# Patient Record
Sex: Female | Born: 1995 | Race: Black or African American | Hispanic: No | Smoking: Never smoker
Health system: Southern US, Community
[De-identification: ages and names within clinical notes are randomized; demographics above are authoritative.]

## PROBLEM LIST (undated history)

## (undated) ENCOUNTER — Inpatient Hospital Stay (HOSPITAL_COMMUNITY): Payer: Self-pay

## (undated) DIAGNOSIS — E559 Vitamin D deficiency, unspecified: Secondary | ICD-10-CM

## (undated) DIAGNOSIS — A749 Chlamydial infection, unspecified: Secondary | ICD-10-CM

## (undated) DIAGNOSIS — Z789 Other specified health status: Secondary | ICD-10-CM

## (undated) HISTORY — DX: Chlamydial infection, unspecified: A74.9

## (undated) HISTORY — PX: NO PAST SURGERIES: SHX2092

## (undated) HISTORY — DX: Vitamin D deficiency, unspecified: E55.9

---

## 2016-02-21 ENCOUNTER — Ambulatory Visit: Payer: Self-pay | Admitting: Women's Health

## 2018-05-02 ENCOUNTER — Other Ambulatory Visit (HOSPITAL_COMMUNITY): Payer: Self-pay | Admitting: Nurse Practitioner

## 2018-05-02 DIAGNOSIS — Z369 Encounter for antenatal screening, unspecified: Secondary | ICD-10-CM

## 2018-05-02 LAB — OB RESULTS CONSOLE ANTIBODY SCREEN: ANTIBODY SCREEN: NEGATIVE

## 2018-05-02 LAB — OB RESULTS CONSOLE HIV ANTIBODY (ROUTINE TESTING): HIV: NONREACTIVE

## 2018-05-02 LAB — OB RESULTS CONSOLE HEPATITIS B SURFACE ANTIGEN: Hepatitis B Surface Ag: NEGATIVE

## 2018-05-02 LAB — OB RESULTS CONSOLE RPR: RPR: NONREACTIVE

## 2018-05-02 LAB — OB RESULTS CONSOLE RUBELLA ANTIBODY, IGM: Rubella: IMMUNE

## 2018-05-02 LAB — OB RESULTS CONSOLE ABO/RH: RH Type: POSITIVE

## 2018-05-09 ENCOUNTER — Encounter (HOSPITAL_COMMUNITY): Payer: Self-pay

## 2018-05-15 ENCOUNTER — Encounter (HOSPITAL_COMMUNITY): Payer: Self-pay | Admitting: *Deleted

## 2018-05-17 ENCOUNTER — Other Ambulatory Visit (HOSPITAL_COMMUNITY): Payer: Self-pay | Admitting: Nurse Practitioner

## 2018-05-17 ENCOUNTER — Encounter (HOSPITAL_COMMUNITY): Payer: Self-pay

## 2018-05-17 ENCOUNTER — Ambulatory Visit (HOSPITAL_COMMUNITY)
Admission: RE | Admit: 2018-05-17 | Discharge: 2018-05-17 | Disposition: A | Discharge status: Home / Self Care | Payer: Medicaid Other | Source: Ambulatory Visit | Attending: Nurse Practitioner | Admitting: Nurse Practitioner

## 2018-05-17 ENCOUNTER — Ambulatory Visit (HOSPITAL_COMMUNITY): Admission: RE | Admit: 2018-05-17 | Payer: Medicaid Other | Source: Ambulatory Visit

## 2018-05-17 DIAGNOSIS — O99211 Obesity complicating pregnancy, first trimester: Secondary | ICD-10-CM

## 2018-05-17 DIAGNOSIS — Z3682 Encounter for antenatal screening for nuchal translucency: Secondary | ICD-10-CM

## 2018-05-17 DIAGNOSIS — Z369 Encounter for antenatal screening, unspecified: Secondary | ICD-10-CM

## 2018-05-17 DIAGNOSIS — O99212 Obesity complicating pregnancy, second trimester: Secondary | ICD-10-CM

## 2018-05-17 DIAGNOSIS — Z3A13 13 weeks gestation of pregnancy: Secondary | ICD-10-CM

## 2018-05-17 HISTORY — DX: Other specified health status: Z78.9

## 2018-05-19 ENCOUNTER — Other Ambulatory Visit (HOSPITAL_COMMUNITY): Payer: Self-pay | Admitting: Nurse Practitioner

## 2018-05-19 DIAGNOSIS — Z3682 Encounter for antenatal screening for nuchal translucency: Secondary | ICD-10-CM

## 2018-05-20 ENCOUNTER — Other Ambulatory Visit (HOSPITAL_COMMUNITY): Payer: Self-pay | Admitting: Nurse Practitioner

## 2018-05-20 ENCOUNTER — Encounter (HOSPITAL_COMMUNITY): Payer: Self-pay

## 2018-05-20 ENCOUNTER — Ambulatory Visit (HOSPITAL_COMMUNITY)
Admission: RE | Admit: 2018-05-20 | Discharge: 2018-05-20 | Disposition: A | Discharge status: Home / Self Care | Payer: Medicaid Other | Source: Ambulatory Visit | Attending: Nurse Practitioner | Admitting: Nurse Practitioner

## 2018-05-20 ENCOUNTER — Ambulatory Visit (HOSPITAL_COMMUNITY)
Admission: RE | Admit: 2018-05-20 | Payer: Medicaid Other | Source: Ambulatory Visit | Attending: Obstetrics & Gynecology | Admitting: Obstetrics & Gynecology

## 2018-05-20 DIAGNOSIS — O99212 Obesity complicating pregnancy, second trimester: Secondary | ICD-10-CM

## 2018-05-20 DIAGNOSIS — O99211 Obesity complicating pregnancy, first trimester: Secondary | ICD-10-CM | POA: Diagnosis not present

## 2018-05-20 DIAGNOSIS — Z3A13 13 weeks gestation of pregnancy: Secondary | ICD-10-CM | POA: Diagnosis not present

## 2018-05-20 DIAGNOSIS — O321XX0 Maternal care for breech presentation, not applicable or unspecified: Secondary | ICD-10-CM | POA: Insufficient documentation

## 2018-05-20 DIAGNOSIS — Z3682 Encounter for antenatal screening for nuchal translucency: Secondary | ICD-10-CM | POA: Insufficient documentation

## 2018-05-20 DIAGNOSIS — E669 Obesity, unspecified: Secondary | ICD-10-CM | POA: Insufficient documentation

## 2018-10-01 ENCOUNTER — Encounter (HOSPITAL_COMMUNITY): Payer: Self-pay | Admitting: *Deleted

## 2018-10-01 ENCOUNTER — Inpatient Hospital Stay (HOSPITAL_COMMUNITY)
Admission: AD | Admit: 2018-10-01 | Discharge: 2018-10-01 | Disposition: A | Discharge status: Home / Self Care | Payer: BLUE CROSS/BLUE SHIELD | Source: Ambulatory Visit | Attending: Obstetrics & Gynecology | Admitting: Obstetrics & Gynecology

## 2018-10-01 DIAGNOSIS — Z3A32 32 weeks gestation of pregnancy: Secondary | ICD-10-CM

## 2018-10-01 DIAGNOSIS — R102 Pelvic and perineal pain: Secondary | ICD-10-CM | POA: Diagnosis not present

## 2018-10-01 DIAGNOSIS — O4703 False labor before 37 completed weeks of gestation, third trimester: Secondary | ICD-10-CM

## 2018-10-01 DIAGNOSIS — O26893 Other specified pregnancy related conditions, third trimester: Secondary | ICD-10-CM

## 2018-10-01 LAB — URINALYSIS, ROUTINE W REFLEX MICROSCOPIC
Bilirubin Urine: NEGATIVE
Glucose, UA: NEGATIVE mg/dL
HGB URINE DIPSTICK: NEGATIVE
Ketones, ur: NEGATIVE mg/dL
Nitrite: NEGATIVE
Protein, ur: NEGATIVE mg/dL
SPECIFIC GRAVITY, URINE: 1.006 (ref 1.005–1.030)
pH: 6 (ref 5.0–8.0)

## 2018-10-01 NOTE — Discharge Instructions (Signed)

## 2018-10-01 NOTE — MAU Note (Signed)
Pt had pelvic pain yesterday, was hard to walk, is more pressure today.  Feeling cramping at the top of her abdomen every 5-6 minutes since 1030.  Denies bleeding or LOF.  Reports good fetal movement.

## 2018-10-01 NOTE — MAU Provider Note (Signed)
History     CSN: [id number]  Arrival date and time: 10/01/18 1228   First Provider Initiated Contact with Patient 10/01/18 1320      Chief Complaint  Patient presents with  . Abdominal Pain  . pelvic pressure   HPI Patty Wells is a 22 y.o. G1P0 at 10919w5d who presents with pelvic pressure and contractions. She states she had a sudden increase in pelvic pressure last night making it difficult to walk today. She states she started having contractions every 5 minutes this morning that have since stopped. She denies bleeding or leaking of fluid. Reports normal fetal movement. [facility] and denies any problems so far.   OB History    Gravida  1   Para      Term      Preterm      AB      Living  0     SAB      TAB      Ectopic      Multiple      Live Births              Past Medical History:  Diagnosis Date  . Medical history non-contributory     Past Surgical History:  Procedure Laterality Date  . NO PAST SURGERIES      Family History  Problem Relation Age of Onset  . Hypertension Mother   . Diabetes Maternal Grandmother   . Diabetes Paternal Grandmother     Social History   Tobacco Use  . Smoking status: Never Smoker  . Smokeless tobacco: Never Used  Substance Use Topics  . Alcohol use: Never    Frequency: Never  . Drug use: Not Currently    Types: Marijuana    Comment: Not during pregnancy    Allergies: No Known Allergies  Medications Prior to Admission  Medication Sig Dispense Refill Last Dose  . Acetaminophen (TYLENOL PO) Take by mouth.   Taking  . Prenatal Vit w/Fe-Methylfol-FA (PNV PO) Take by mouth.   Taking    Review of Systems  Constitutional: Negative.  Negative for fatigue and fever.  HENT: Negative.   Respiratory: Negative.  Negative for shortness of breath.   Cardiovascular: Negative.  Negative for chest pain.  Gastrointestinal: Positive for abdominal pain. Negative for constipation, diarrhea,  nausea and vomiting.  Genitourinary: Positive for pelvic pain. Negative for dysuria, vaginal bleeding and vaginal discharge.  Neurological: Negative.  Negative for dizziness and headaches.   Physical Exam   Blood pressure 127/74, pulse (!) 101, temperature 98.3 F (36.8 C), temperature source Oral, resp. rate 18, height 5\' 4"  (1.626 m), weight 108.5 kg, last menstrual period 02/14/2018.  Physical Exam  Nursing note and vitals reviewed. Constitutional: She is oriented to person, place, and time. She appears well-developed and well-nourished. No distress.  HENT:  Head: Normocephalic.  Eyes: Pupils are equal, round, and reactive to light.  Cardiovascular: Normal rate, regular rhythm and normal heart sounds.  Respiratory: Effort normal and breath sounds normal. No respiratory distress.  GI: Soft. Bowel sounds are normal. She exhibits no distension. There is no tenderness.  Neurological: She is alert and oriented to person, place, and time.  Skin: Skin is warm and dry.  Psychiatric: She has a normal mood and affect. Her behavior is normal. Judgment and thought content normal.    Fetal Tracing:  Baseline: 140 Variability: moderate Accels: 15x15 Decels: none  Toco: occasional uc's   Dilation: Closed Effacement (%):  Thick Exam by:: C  CNM  MAU Course  Procedures Results for orders placed or performed during the hospital encounter of 10/01/18 (from the past 24 hour(s))  Urinalysis, Routine w reflex microscopic     Status: Abnormal   Collection Time: 10/01/18  1:12 PM  Result Value Ref Range   Color, Urine STRAW (A) YELLOW   APPearance CLEAR CLEAR   Specific Gravity, Urine 1.006 1.005 - 1.030   pH 6.0 5.0 - 8.0   Glucose, UA NEGATIVE NEGATIVE mg/dL   Hgb urine dipstick NEGATIVE NEGATIVE   Bilirubin Urine NEGATIVE NEGATIVE   Ketones, ur NEGATIVE NEGATIVE mg/dL   Protein, ur NEGATIVE NEGATIVE mg/dL   Nitrite NEGATIVE NEGATIVE   Leukocytes, UA TRACE (A) NEGATIVE   WBC, UA  0-5 0 - 5 WBC/hpf   Bacteria, UA RARE (A) NONE SEEN   Squamous Epithelial / LPF 0-5 0 - 5   MDM UA No signs or symptoms of preterm labor at this time.  Assessment and Plan   1. Pelvic pain affecting pregnancy in third trimester, antepartum   2. Preterm uterine contractions in third trimester, antepartum   3. [redacted] weeks gestation of pregnancy    -Discharge home in stable condition -Encouraged patient to use pregnancy support belt and increase PO hydration -Preterm labor precautions discussed -Patient advised to follow-up with [facility] next week as scheduled for prenatal care -Patient may return to MAU as needed or if her condition were to change or worsen  Rolm Bookbinder CNM 10/01/2018, 1:20 PM

## 2018-10-29 LAB — OB RESULTS CONSOLE GBS: GBS: NEGATIVE

## 2018-10-29 LAB — OB RESULTS CONSOLE GC/CHLAMYDIA
CHLAMYDIA, DNA PROBE: NEGATIVE
Gonorrhea: NEGATIVE

## 2018-11-13 NOTE — L&D Delivery Note (Addendum)
Delivery Note At 6:13 AM a viable and healthy female was delivered via Vaginal, Spontaneous (Presentation: ROA; cephalic  ).  APGAR: 9, 9; Placenta status: spontaneous, intact .  Cord: 3 vessel. Very long cord with true knot x2.   Anesthesia:  Epidural, lidocaine Episiotomy: None Lacerations: 1st degree;Perineal Suture Repair: 3.0 vicryl Est. Blood Loss (mL):   Mom to postpartum.  Baby to Couplet care / Skin to Skin.  Myrene Buddy 11/20/2018, 6:44 AM  I confirm that I have verified the information documented in the resident's note and that I have also personally reperformed the physical exam and all medical decision making activities.  I was gloved and present for entire delivery  I had been in with her 10 min before and we could not see head when she was pushing. She then had a precipitous delivery as we entered room.  RN attended.  No difficulty with shoulders, there was a compound hand.  Lacerations as listed above Repair of same supervised by me  Two true knots in a very long cord     Aviva Signs, CNM  Please schedule this patient for Postpartum visit in: 1 week with the following provider: Any provider For C/S patients schedule nurse incision check in weeks 2 weeks: no Low risk pregnancy complicated by: Mild gestational hypertension and  Postpartum hemorrhage Delivery mode:  SVD Anticipated Birth Control:  other/unsure PP Procedures needed: BP check  Schedule Integrated BH visit: no

## 2018-11-19 ENCOUNTER — Inpatient Hospital Stay (HOSPITAL_COMMUNITY): Payer: Medicaid Other | Admitting: Anesthesiology

## 2018-11-19 ENCOUNTER — Encounter (HOSPITAL_COMMUNITY): Payer: Self-pay

## 2018-11-19 ENCOUNTER — Inpatient Hospital Stay (HOSPITAL_COMMUNITY)
Admission: AD | Admit: 2018-11-19 | Discharge: 2018-11-22 | DRG: 807 | Disposition: A | Discharge status: Home / Self Care | Payer: Medicaid Other | Attending: Family Medicine | Admitting: Family Medicine

## 2018-11-19 ENCOUNTER — Other Ambulatory Visit: Payer: Self-pay

## 2018-11-19 DIAGNOSIS — Z3A39 39 weeks gestation of pregnancy: Secondary | ICD-10-CM

## 2018-11-19 DIAGNOSIS — O99214 Obesity complicating childbirth: Secondary | ICD-10-CM | POA: Diagnosis present

## 2018-11-19 DIAGNOSIS — O134 Gestational [pregnancy-induced] hypertension without significant proteinuria, complicating childbirth: Principal | ICD-10-CM | POA: Diagnosis present

## 2018-11-19 DIAGNOSIS — O4292 Full-term premature rupture of membranes, unspecified as to length of time between rupture and onset of labor: Secondary | ICD-10-CM | POA: Diagnosis present

## 2018-11-19 LAB — COMPREHENSIVE METABOLIC PANEL
ALBUMIN: 2.8 g/dL — AB (ref 3.5–5.0)
ALT: 18 U/L (ref 0–44)
AST: 22 U/L (ref 15–41)
Alkaline Phosphatase: 103 U/L (ref 38–126)
Anion gap: 9 (ref 5–15)
BUN: 6 mg/dL (ref 6–20)
CO2: 19 mmol/L — ABNORMAL LOW (ref 22–32)
Calcium: 8.6 mg/dL — ABNORMAL LOW (ref 8.9–10.3)
Chloride: 106 mmol/L (ref 98–111)
Creatinine, Ser: 0.65 mg/dL (ref 0.44–1.00)
GFR calc Af Amer: >60 mL/min (ref >60)
GFR calc non Af Amer: >60 mL/min (ref >60)
Glucose, Bld: 84 mg/dL (ref 70–99)
POTASSIUM: 3.8 mmol/L (ref 3.5–5.1)
Sodium: 134 mmol/L — ABNORMAL LOW (ref 135–145)
Total Bilirubin: 0.8 mg/dL (ref 0.3–1.2)
Total Protein: 6.4 g/dL — ABNORMAL LOW (ref 6.5–8.1)

## 2018-11-19 LAB — CBC
HCT: 40 % (ref 36.0–46.0)
HCT: 39.1 % (ref 36.0–46.0)
Hemoglobin: 13.6 g/dL (ref 12.0–15.0)
Hemoglobin: 13.6 g/dL (ref 12.0–15.0)
MCH: 30 pg (ref 26.0–34.0)
MCH: 30.6 pg (ref 26.0–34.0)
MCHC: 34 g/dL (ref 30.0–36.0)
MCHC: 34.8 g/dL (ref 30.0–36.0)
MCV: 88.3 fL (ref 80.0–100.0)
MCV: 88.1 fL (ref 80.0–100.0)
PLATELETS: 202 10*3/uL (ref 150–400)
PLATELETS: 191 10*3/uL (ref 150–400)
RBC: 4.53 MIL/uL (ref 3.87–5.11)
RBC: 4.44 MIL/uL (ref 3.87–5.11)
RDW: 13.9 % (ref 11.5–15.5)
RDW: 13.9 % (ref 11.5–15.5)
WBC: 7.4 10*3/uL (ref 4.0–10.5)
WBC: 8.5 10*3/uL (ref 4.0–10.5)
nRBC: 0 % (ref 0.0–0.2)
nRBC: 0 % (ref 0.0–0.2)

## 2018-11-19 LAB — PROTEIN / CREATININE RATIO, URINE
Creatinine, Urine: 98 mg/dL
Protein Creatinine Ratio: 0.17 mg/mg{Cre} — ABNORMAL HIGH (ref 0.00–0.15)
Total Protein, Urine: 17 mg/dL

## 2018-11-19 LAB — RPR: RPR Ser Ql: NONREACTIVE

## 2018-11-19 LAB — ABO/RH: ABO/RH(D): B POS

## 2018-11-19 LAB — POCT FERN TEST: POCT Fern Test: POSITIVE

## 2018-11-19 MED ORDER — OXYTOCIN 40 UNITS IN LACTATED RINGERS INFUSION - SIMPLE MED: 2.5000 [IU]/h | INTRAVENOUS | Status: DC

## 2018-11-19 MED ORDER — FENTANYL 2.5 MCG/ML BUPIVACAINE 1/10 % EPIDURAL INFUSION (WH - ANES)
14.0000 mL/h | INTRAMUSCULAR | Status: DC | PRN
  Administered 2018-11-19 (×2): 14 mL/h via EPIDURAL
  Filled 2018-11-19 (×2): qty 100

## 2018-11-19 MED ORDER — OXYCODONE-ACETAMINOPHEN 5-325 MG PO TABS: 1.0000 | ORAL_TABLET | ORAL | Status: DC | PRN

## 2018-11-19 MED ORDER — LIDOCAINE HCL (PF) 1 % IJ SOLN
30.0000 mL | INTRAMUSCULAR | Status: AC | PRN
  Administered 2018-11-20: 30 mL via SUBCUTANEOUS
  Filled 2018-11-19: qty 30

## 2018-11-19 MED ORDER — LIDOCAINE HCL (PF) 1 % IJ SOLN
INTRAMUSCULAR | Status: DC | PRN
  Administered 2018-11-19: 5 mL via EPIDURAL
  Administered 2018-11-19: 2 mL via EPIDURAL
  Administered 2018-11-19: 3 mL via EPIDURAL

## 2018-11-19 MED ORDER — LACTATED RINGERS IV SOLN: 500.0000 mL | INTRAVENOUS | Status: DC | PRN

## 2018-11-19 MED ORDER — MISOPROSTOL 25 MCG QUARTER TABLET
25.0000 ug | ORAL_TABLET | ORAL | Status: DC | PRN
  Administered 2018-11-19 (×2): 25 ug via VAGINAL
  Filled 2018-11-19 (×3): qty 1

## 2018-11-19 MED ORDER — SOD CITRATE-CITRIC ACID 500-334 MG/5ML PO SOLN: 30.0000 mL | ORAL | Status: DC | PRN

## 2018-11-19 MED ORDER — ACETAMINOPHEN 325 MG PO TABS
650.0000 mg | ORAL_TABLET | ORAL | Status: DC | PRN
  Administered 2018-11-20 (×2): 650 mg via ORAL
  Filled 2018-11-19 (×2): qty 2

## 2018-11-19 MED ORDER — ONDANSETRON HCL 4 MG/2ML IJ SOLN
4.0000 mg | Freq: Four times a day (QID) | INTRAMUSCULAR | Status: DC | PRN
  Administered 2018-11-19: 4 mg via INTRAVENOUS
  Filled 2018-11-19: qty 2

## 2018-11-19 MED ORDER — TERBUTALINE SULFATE 1 MG/ML IJ SOLN
0.2500 mg | Freq: Once | INTRAMUSCULAR | Status: DC | PRN
  Filled 2018-11-19: qty 1

## 2018-11-19 MED ORDER — LACTATED RINGERS IV SOLN: 500.0000 mL | Freq: Once | INTRAVENOUS | Status: DC

## 2018-11-19 MED ORDER — OXYTOCIN BOLUS FROM INFUSION
500.0000 mL | Freq: Once | INTRAVENOUS | Status: AC
  Administered 2018-11-20: 500 mL via INTRAVENOUS

## 2018-11-19 MED ORDER — OXYCODONE-ACETAMINOPHEN 5-325 MG PO TABS: 2.0000 | ORAL_TABLET | ORAL | Status: DC | PRN

## 2018-11-19 MED ORDER — LACTATED RINGERS IV SOLN
INTRAVENOUS | Status: DC
  Administered 2018-11-19 – 2018-11-20 (×3): via INTRAVENOUS

## 2018-11-19 MED ORDER — FENTANYL CITRATE (PF) 100 MCG/2ML IJ SOLN
100.0000 ug | INTRAMUSCULAR | Status: DC | PRN
  Administered 2018-11-19 (×2): 100 ug via INTRAVENOUS
  Filled 2018-11-19 (×2): qty 2

## 2018-11-19 MED ORDER — PHENYLEPHRINE 40 MCG/ML (10ML) SYRINGE FOR IV PUSH (FOR BLOOD PRESSURE SUPPORT)
80.0000 ug | PREFILLED_SYRINGE | INTRAVENOUS | Status: DC | PRN
  Filled 2018-11-19: qty 10

## 2018-11-19 MED ORDER — OXYTOCIN 40 UNITS IN NORMAL SALINE INFUSION - SIMPLE MED
2.5000 [IU]/h | INTRAVENOUS | Status: DC
  Filled 2018-11-19 (×2): qty 1000

## 2018-11-19 MED ORDER — DIPHENHYDRAMINE HCL 50 MG/ML IJ SOLN: 12.5000 mg | INTRAMUSCULAR | Status: DC | PRN

## 2018-11-19 MED ORDER — OXYTOCIN 40 UNITS IN NORMAL SALINE INFUSION - SIMPLE MED: 1.0000 m[IU]/min | INTRAVENOUS | Status: DC

## 2018-11-19 MED ORDER — EPHEDRINE 5 MG/ML INJ
10.0000 mg | INTRAVENOUS | Status: DC | PRN
  Filled 2018-11-19: qty 2

## 2018-11-19 MED ORDER — PHENYLEPHRINE 40 MCG/ML (10ML) SYRINGE FOR IV PUSH (FOR BLOOD PRESSURE SUPPORT)
80.0000 ug | PREFILLED_SYRINGE | INTRAVENOUS | Status: DC | PRN
  Filled 2018-11-19 (×2): qty 10

## 2018-11-19 MED ORDER — OXYTOCIN BOLUS FROM INFUSION: 500.0000 mL | Freq: Once | INTRAVENOUS | Status: DC

## 2018-11-19 NOTE — MAU Note (Signed)
Pt presents to MAU c/o SROM @ 0430 clear fluid with a bloody tinge. Pt reports she woke up to the gush of fluid and since she was asleep she is unsure of FM.

## 2018-11-19 NOTE — H&P (Addendum)
LABOR AND DELIVERY ADMISSION HISTORY AND PHYSICAL NOTE  Agathe Mccluney is a 23 y.o. female G1P0 with IUP at [redacted]w[redacted]d by LMP presenting for PROM @ 0430. She reports positive fetal movement.   Prenatal History/Complications: PNC at [facility] Pregnancy complications:  - unable to visualize nuchal translucency on U/S x2  Past Medical History: Past Medical History:  Diagnosis Date  . Medical history non-contributory     Past Surgical History: Past Surgical History:  Procedure Laterality Date  . NO PAST SURGERIES      Obstetrical History: OB History    Gravida  1   Para      Term      Preterm      AB      Living  0     SAB      TAB      Ectopic      Multiple      Live Births              Social History: Social History   Socioeconomic History  . Marital status: Single    Spouse name: Not on file  . Number of children: Not on file  . Years of education: Not on file  . Highest education level: Not on file  Occupational History  . Not on file  Social Needs  . Financial resource strain: Not on file  . Food insecurity:    Worry: Not on file    Inability: Not on file  . Transportation needs:    Medical: Not on file    Non-medical: Not on file  Tobacco Use  . Smoking status: Never Smoker  . Smokeless tobacco: Never Used  Substance and Sexual Activity  . Alcohol use: Never    Frequency: Never  . Drug use: Not Currently    Types: Marijuana    Comment: Not during pregnancy  . Sexual activity: Yes    Birth control/protection: None  Lifestyle  . Physical activity:    Days per week: Not on file    Minutes per session: Not on file  . Stress: Not on file  Relationships  . Social connections:    Talks on phone: Not on file    Gets together: Not on file    Attends religious service: Not on file    Active member of club or organization: Not on file    Attends meetings of clubs or organizations: Not on file    Relationship status: Not on file  Other Topics  Concern  . Not on file  Social History Narrative  . Not on file    Family History: Family History  Problem Relation Age of Onset  . Hypertension Mother   . Diabetes Maternal Grandmother   . Diabetes Paternal Grandmother     Allergies: No Known Allergies  Medications Prior to Admission  Medication Sig Dispense Refill Last Dose  . amoxicillin (AMOXIL) 500 MG capsule Take 500 mg by mouth 3 (three) times daily.   11/18/2018 at Unknown time  . Prenatal Vit w/Fe-Methylfol-FA (PNV PO) Take 1 tablet by mouth daily.    11/18/2018 at Unknown time     Review of Systems  All systems reviewed and negative except as stated in HPI  Physical Exam Blood pressure (!) 146/97, pulse 96, temperature 98.6 F (37 C), temperature source Oral, resp. rate 18, height 5\' 3"  (1.6 m), weight 113.4 kg, last menstrual period 02/14/2018. General appearance: alert, oriented, NAD Lungs: normal respiratory effort Heart: regular rate Abdomen: soft,  non-tender; gravid, FH appropriate for GA Extremities: No calf swelling or tenderness Presentation: cephalic Fetal monitoring: cat 1 Uterine activity: no adequate contractions Dilation: Closed Effacement (%): Thick Exam by:: Latricia HeftAnna Cioce, RN  Prenatal labs: ABO, Rh:   B Pos Antibody:   neg Rubella:   neg RPR:   neg HBsAg:    neg HIV:   neg GC/Chlamydia: neg GBS:   neg Fasting/1/2/3-hr GTT: 87/144/111/98 Genetic screening:  Quad screen normal Anatomy US: Unable to visualize nuchal translucency  Prenatal Transfer Tool  Maternal Diabetes: No Genetic Screening: Normal Maternal Ultrasounds/Referrals: Abnormal:  Findings:   Other: could not visualize nuchal translucency Fetal Ultrasounds or other Referrals:  None Maternal Substance Abuse:  No Significant Maternal Medications:  None Significant Maternal Lab Results: None  Results for orders placed or performed during the hospital encounter of 11/19/18 (from the past 24 hour(s))  Fern Test   Collection  Time: 11/19/18  5:41 AM  Result Value Ref Range   POCT Fern Test Positive = ruptured amniotic membanes   CBC   Collection Time: 11/19/18  6:33 AM  Result Value Ref Range   WBC 7.4 4.0 - 10.5 K/uL   RBC 4.53 3.87 - 5.11 MIL/uL   Hemoglobin 13.6 12.0 - 15.0 g/dL   HCT 16.140.0 09.636.0 - 04.546.0 %   MCV 88.3 80.0 - 100.0 fL   MCH 30.0 26.0 - 34.0 pg   MCHC 34.0 30.0 - 36.0 g/dL   RDW 40.913.9 81.111.5 - 91.415.5 %   Platelets 202 150 - 400 K/uL   nRBC 0.0 0.0 - 0.2 %    Patient Active Problem List   Diagnosis Date Noted  . Normal labor and delivery 11/19/2018    Assessment: Barnet PallJameshia Ent is a 23 y.o. G1P0 at 4731w5d here for SROM at 0430. Cephalic position confirmed via beside U/S. Closed cevix. Will start on cytotec. Patient is unsure if she wants a foley balloon. Will re-eval when appropriate. Patient with some mildly elevated Bps (130s systolic) in MAU. No vision blurriness or any symptoms. Will check CMP and P/C ratio.  #Labor: cytotec #Pain: IV pain meds #FWB: Cat 1 #ID:  GBS neg #MOF: breast #MOC:Condoms #Circ:  Yes, Outpatient  Myrene BuddyJacob Fletcher MD PGY-2 Family Medicine Resident 11/19/2018, 7:24 AM   Attestation: I have seen this patient and agree with the resident's documentation. I have examined them separately, and we have discussed the plan of care. Leopold's 8-9lbs.   Cristal DeerLaurel S. Earlene PlaterWallace, DO OB/GYN Fellow

## 2018-11-19 NOTE — Progress Notes (Addendum)
LABOR PROGRESS NOTE  Patty Wells is a 23 y.o. G1P0 at [redacted]w[redacted]d  admitted for PROM.   Subjective: Patient uncomfortable with contractions.   Objective: BP (!) 134/91   Pulse 90   Temp 98.7 F (37.1 C) (Oral)   Resp 20   Ht 5\' 3"  (1.6 m)   Wt 113.4 kg   LMP 02/14/2018   BMI 44.29 kg/m  or  Vitals:   11/19/18 0728 11/19/18 0829 11/19/18 0921 11/19/18 1012  BP: 130/89 140/81 135/77 (!) 134/91  Pulse: 89 (!) 103 88 90  Resp: 18 20 20 20   Temp:   98.7 F (37.1 C)   TempSrc:   Oral   Weight:      Height:        Dilation: 1 Effacement (%): Thick Presentation: Vertex Exam by:: [place], C FHT: baseline rate 135, moderate varibility, +acel, no decel Toco: q3-5 min   Labs: Lab Results  Component Value Date   WBC 7.4 11/19/2018   HGB 13.6 11/19/2018   HCT 40.0 11/19/2018   MCV 88.3 11/19/2018   PLT 202 11/19/2018    Patient Active Problem List   Diagnosis Date Noted  . Normal labor and delivery 11/19/2018    Assessment / Plan: 23 y.o. G1P0 at [redacted]w[redacted]d here for PROM.   Labor: Will plan to give 2nd cytotec. Confirmed vertex presentation with bedside sono.  Fetal Wellbeing:  Cat I  Pain Control:  IV fentanyl now. Plans for epidural.  Anticipated MOD:  NSVD   Marcy Siren, D.O. OB Fellow  11/19/2018, 10:56 AM

## 2018-11-19 NOTE — Progress Notes (Signed)
LABOR PROGRESS NOTE  Patty Wells is a 23 y.o. G1P0 at [redacted]w[redacted]d  admitted for PROM.  Subjective: Pt was getting her epidural.  Objective: BP (!) 141/97   Pulse 82   Temp 98.4 F (36.9 C) (Oral)   Resp 20   Ht 5\' 3"  (1.6 m)   Wt 113.4 kg   LMP 02/14/2018   BMI 44.29 kg/m  or  Vitals:   11/19/18 1122 11/19/18 1226 11/19/18 1323 11/19/18 1457  BP: 134/81 114/84 140/81 (!) 141/97  Pulse: 83 80 78 82  Resp: 18 20 18 20   Temp: 98.3 F (36.8 C)  98.4 F (36.9 C)   TempSrc: Oral  Oral   Weight:      Height:        Dilation: 1 Effacement (%): Thick Presentation: Vertex Exam by:: A Showfety RN FHT: baseline rate 135, moderate varibility, 15x15 acel, no decel Toco: Mild contractions q1.5-41min, 60-80s duration  Labs: Lab Results  Component Value Date   WBC 8.5 11/19/2018   HGB 13.6 11/19/2018   HCT 39.1 11/19/2018   MCV 88.1 11/19/2018   PLT 191 11/19/2018    Patient Active Problem List   Diagnosis Date Noted  . Normal labor and delivery 11/19/2018    Assessment / Plan: 23 y.o. G1P0 at [redacted]w[redacted]d here for PROM.  Labor: Cytotec x 2 (7510,2585) Fetal Wellbeing: Category 1 Pain Control:  Fentanyl, currently getting epidural Anticipated MOD: NSVD  Trenda Moots MS3  OB Fellow  11/19/2018, 3:20 PM

## 2018-11-19 NOTE — Anesthesia Preprocedure Evaluation (Signed)
Anesthesia Evaluation  Patient identified by MRN, date of birth, ID band Patient awake    Reviewed: Allergy & Precautions, NPO status , Patient's Chart, lab work & pertinent test results  Airway Mallampati: II  TM Distance: >3 FB Neck ROM: Full    Dental  (+) Teeth Intact, Dental Advisory Given   Pulmonary neg pulmonary ROS,    Pulmonary exam normal breath sounds clear to auscultation       Cardiovascular negative cardio ROS Normal cardiovascular exam Rhythm:Regular Rate:Normal     Neuro/Psych negative neurological ROS     GI/Hepatic negative GI ROS, Neg liver ROS,   Endo/Other  Morbid obesity  Renal/GU negative Renal ROS     Musculoskeletal negative musculoskeletal ROS (+)   Abdominal   Peds  Hematology negative hematology ROS (+) Plt 191k   Anesthesia Other Findings Day of surgery medications reviewed with the patient.  Reproductive/Obstetrics (+) Pregnancy                             Anesthesia Physical Anesthesia Plan  ASA: III  Anesthesia Plan: Epidural   Post-op Pain Management:    Induction:   PONV Risk Score and Plan: 2 and Treatment may vary due to age or medical condition  Airway Management Planned: Natural Airway  Additional Equipment:   Intra-op Plan:   Post-operative Plan:   Informed Consent: I have reviewed the patients History and Physical, chart, labs and discussed the procedure including the risks, benefits and alternatives for the proposed anesthesia with the patient or authorized representative who has indicated his/her understanding and acceptance.   Dental advisory given  Plan Discussed with:   Anesthesia Plan Comments: (Patient identified. Risks/Benefits/Options discussed with patient including but not limited to bleeding, infection, nerve damage, paralysis, failed block, incomplete pain control, headache, blood pressure changes, nausea, vomiting,  reactions to medication both or allergic, itching and postpartum back pain. Confirmed with bedside nurse the patient's most recent platelet count. Confirmed with patient that they are not currently taking any anticoagulation, have any bleeding history or any family history of bleeding disorders. Patient expressed understanding and wished to proceed. All questions were answered. )        Anesthesia Quick Evaluation

## 2018-11-19 NOTE — Anesthesia Pain Management Evaluation Note (Signed)
  CRNA Pain Management Visit Note  Patient: Patty Wells, 23 y.o., female  "Hello I am a member of the anesthesia team at [facility]. We have an anesthesia team available at all times to provide care throughout the hospital, including epidural management and anesthesia for C-section. I don't know your plan for the delivery whether it a natural birth, water birth, IV sedation, nitrous supplementation, doula or epidural, but we want to meet your pain goals."   1.Was your pain managed to your expectations on prior hospitalizations?   No prior hospitalizations  2.What is your expectation for pain management during this hospitalization?     Labor support without medications  3.How can we help you reach that goal? Pt desires natural labor  Record the patient's initial score and the patient's pain goal.   Pain: 6  Pain Goal: 10 [facility] wants you to be able to say your pain was always managed very well.  Patty Wells 11/19/2018

## 2018-11-19 NOTE — Anesthesia Procedure Notes (Signed)
Epidural Patient location during procedure: OB Start time: 11/19/2018 3:44 PM End time: 11/19/2018 3:50 PM  Staffing Anesthesiologist: Cecile Hearing, MD Performed: anesthesiologist   Preanesthetic Checklist Completed: patient identified, pre-op evaluation, timeout performed, IV checked, risks and benefits discussed and monitors and equipment checked  Epidural Patient position: sitting Prep: DuraPrep Patient monitoring: blood pressure and continuous pulse ox Approach: midline Location: L3-L4 Injection technique: LOR air  Needle:  Needle type: Tuohy  Needle gauge: 17 G Needle length: 9 cm Needle insertion depth: 9 cm Catheter size: 19 Gauge Catheter at skin depth: 15 cm Test dose: negative and Other (1% Lidocaine)  Additional Notes Patient identified.  Risk benefits discussed including failed block, incomplete pain control, headache, nerve damage, paralysis, blood pressure changes, nausea, vomiting, reactions to medication both toxic or allergic, and postpartum back pain.  Patient expressed understanding and wished to proceed.  All questions were answered.  Sterile technique used throughout procedure and epidural site dressed with sterile barrier dressing. No paresthesia or other complications noted. The patient did not experience any signs of intravascular injection such as tinnitus or metallic taste in mouth nor signs of intrathecal spread such as rapid motor block. Please see nursing notes for vital signs. Reason for block:procedure for pain

## 2018-11-19 NOTE — Progress Notes (Signed)
Patty Wells is a 23 y.o. G1P0 at [redacted]w[redacted]d admitted for PROM  Subjective: Doing well with no issues. Pain well controlled.  Objective: BP 138/90   Pulse 93   Temp (!) 97.5 F (36.4 C) (Axillary)   Resp 18   Ht 5\' 3"  (1.6 m)   Wt 113.4 kg   LMP 02/14/2018   BMI 44.29 kg/m  No intake/output data recorded. Total I/O In: -  Out: 300 [Urine:300]  FHT:  FHR: 150 bpm, variability: moderate,  accelerations:  Present,  decelerations:  Absent UC:   irregular, every 2-3 minutes, inadequate SVE:   Dilation: 4.5 Effacement (%): 80 Station: -3 Exam by:: Jonelle Sidle, RN  Labs: Lab Results  Component Value Date   WBC 8.5 11/19/2018   HGB 13.6 11/19/2018   HCT 39.1 11/19/2018   MCV 88.1 11/19/2018   PLT 191 11/19/2018    Assessment / Plan: 23 year old G1P0 for PROM and elevated pressures in the BP.  Labor: Making some progress, s/p cyto x2. Preeclampsia:  N/A Fetal Wellbeing:  Category I Pain Control:  Epidural I/D:  n/a Anticipated MOD:  NSVD  Myrene Buddy 11/19/2018, 9:34 PM

## 2018-11-20 ENCOUNTER — Encounter (HOSPITAL_COMMUNITY): Payer: Self-pay | Admitting: *Deleted

## 2018-11-20 DIAGNOSIS — Z3A39 39 weeks gestation of pregnancy: Secondary | ICD-10-CM

## 2018-11-20 LAB — TYPE AND SCREEN
ABO/RH(D): B POS
Antibody Screen: NEGATIVE
Unit division: 0
Unit division: 0

## 2018-11-20 LAB — CBC
HCT: 36.8 % (ref 36.0–46.0)
Hemoglobin: 12.4 g/dL (ref 12.0–15.0)
MCH: 30.2 pg (ref 26.0–34.0)
MCHC: 33.7 g/dL (ref 30.0–36.0)
MCV: 89.5 fL (ref 80.0–100.0)
NRBC: 0.1 % (ref 0.0–0.2)
Platelets: 206 10*3/uL (ref 150–400)
RBC: 4.11 MIL/uL (ref 3.87–5.11)
RDW: 14 % (ref 11.5–15.5)
WBC: 13.1 10*3/uL — AB (ref 4.0–10.5)

## 2018-11-20 LAB — BPAM RBC
BLOOD PRODUCT EXPIRATION DATE: 202002102359
Blood Product Expiration Date: 202002102359
UNIT TYPE AND RH: 5100
Unit Type and Rh: 5100

## 2018-11-20 MED ORDER — OXYTOCIN 40 UNITS IN NORMAL SALINE INFUSION - SIMPLE MED
INTRAVENOUS | Status: AC
  Administered 2018-11-20: 09:00:00
  Filled 2018-11-20: qty 1000

## 2018-11-20 MED ORDER — LACTATED RINGERS IV SOLN: INTRAVENOUS | Status: DC

## 2018-11-20 MED ORDER — MISOPROSTOL 200 MCG PO TABS
ORAL_TABLET | ORAL | Status: AC
  Administered 2018-11-20: 800 ug
  Filled 2018-11-20: qty 5

## 2018-11-20 MED ORDER — CEFAZOLIN SODIUM-DEXTROSE 2-4 GM/100ML-% IV SOLN
2.0000 g | Freq: Once | INTRAVENOUS | Status: AC
  Administered 2018-11-20: 2 g via INTRAVENOUS
  Filled 2018-11-20: qty 100

## 2018-11-20 MED ORDER — LACTATED RINGERS IV BOLUS: 1000.0000 mL | Freq: Once | INTRAVENOUS | Status: DC

## 2018-11-20 MED ORDER — TETANUS-DIPHTH-ACELL PERTUSSIS 5-2.5-18.5 LF-MCG/0.5 IM SUSP: 0.5000 mL | Freq: Once | INTRAMUSCULAR | Status: DC

## 2018-11-20 MED ORDER — SENNOSIDES-DOCUSATE SODIUM 8.6-50 MG PO TABS
2.0000 | ORAL_TABLET | ORAL | Status: DC
  Administered 2018-11-21 – 2018-11-22 (×2): 2 via ORAL
  Filled 2018-11-20 (×2): qty 2

## 2018-11-20 MED ORDER — ZOLPIDEM TARTRATE 5 MG PO TABS: 5.0000 mg | ORAL_TABLET | Freq: Every evening | ORAL | Status: DC | PRN

## 2018-11-20 MED ORDER — ONDANSETRON HCL 4 MG PO TABS: 4.0000 mg | ORAL_TABLET | ORAL | Status: DC | PRN

## 2018-11-20 MED ORDER — BENZOCAINE-MENTHOL 20-0.5 % EX AERO
1.0000 "application " | INHALATION_SPRAY | CUTANEOUS | Status: DC | PRN
  Administered 2018-11-20: 1 via TOPICAL
  Filled 2018-11-20: qty 56

## 2018-11-20 MED ORDER — DIPHENHYDRAMINE HCL 25 MG PO CAPS: 25.0000 mg | ORAL_CAPSULE | Freq: Four times a day (QID) | ORAL | Status: DC | PRN

## 2018-11-20 MED ORDER — ACETAMINOPHEN 325 MG PO TABS
650.0000 mg | ORAL_TABLET | ORAL | Status: DC | PRN
  Administered 2018-11-20: 650 mg via ORAL
  Filled 2018-11-20: qty 2

## 2018-11-20 MED ORDER — FENTANYL CITRATE (PF) 100 MCG/2ML IJ SOLN
INTRAMUSCULAR | Status: AC
  Filled 2018-11-20: qty 4

## 2018-11-20 MED ORDER — IBUPROFEN 600 MG PO TABS
600.0000 mg | ORAL_TABLET | Freq: Four times a day (QID) | ORAL | Status: DC
  Administered 2018-11-20 – 2018-11-22 (×8): 600 mg via ORAL
  Filled 2018-11-20 (×8): qty 1

## 2018-11-20 MED ORDER — CARBOPROST TROMETHAMINE 250 MCG/ML IM SOLN
INTRAMUSCULAR | Status: AC
  Filled 2018-11-20: qty 1

## 2018-11-20 MED ORDER — SIMETHICONE 80 MG PO CHEW: 80.0000 mg | CHEWABLE_TABLET | ORAL | Status: DC | PRN

## 2018-11-20 MED ORDER — COCONUT OIL OIL: 1.0000 "application " | TOPICAL_OIL | Status: DC | PRN

## 2018-11-20 MED ORDER — PRENATAL MULTIVITAMIN CH
1.0000 | ORAL_TABLET | Freq: Every day | ORAL | Status: DC
  Administered 2018-11-21 – 2018-11-22 (×2): 1 via ORAL
  Filled 2018-11-20 (×2): qty 1

## 2018-11-20 MED ORDER — METHYLERGONOVINE MALEATE 0.2 MG/ML IJ SOLN
INTRAMUSCULAR | Status: AC
  Filled 2018-11-20: qty 3

## 2018-11-20 MED ORDER — WITCH HAZEL-GLYCERIN EX PADS: 1.0000 "application " | MEDICATED_PAD | CUTANEOUS | Status: DC | PRN

## 2018-11-20 MED ORDER — ONDANSETRON HCL 4 MG/2ML IJ SOLN: 4.0000 mg | INTRAMUSCULAR | Status: DC | PRN

## 2018-11-20 MED ORDER — DIBUCAINE 1 % RE OINT: 1.0000 "application " | TOPICAL_OINTMENT | RECTAL | Status: DC | PRN

## 2018-11-20 NOTE — Progress Notes (Signed)
[facility] She was alert, talkative, fundus firm and small amount of vag bleeding. Peri pad was changed. Assisted mother with breastfeeding. Patient reported increase uterine cramping, passed lemon size clot and had a trickle of blood. Called resident to report clots and increased bleeding. Patient then had several large clots and a Code Hemorrhage was called and immediate help came to the patient's room. Patient was cooperative and stayed mostly calm during the process.

## 2018-11-20 NOTE — Lactation Note (Addendum)
This note was copied from a baby's chart. Lactation Consultation Note  Patient Name: Patty Wells Date: 11/20/2018 Reason for consult: Follow-up assessment;Primapara;1st time breastfeeding;Term;Difficult latch  14 hours old FT female who is being exclusively BF by his mother, she's a P1. RN Meriam Sprague called out for assistance, mom had a PPH. Baby not wanting to feed at this point, when LC took baby to changed his diaper (1 meconium stool) and then put him back to the breast in cross cradle position but he went back to sleep. He would barely suck on a gloved finger. Asked mom to call for latch assistance. An attempt was documented in Flowsheets.   Assisted mom with hand expression, she has abundant colostrum, but noticed that her nipples are flat/very short shafted, but her tissue is very compressible. LC finger fed baby some drops. LC set mom up with shells and a hand pump, instructions, cleaning and storage were reviewed as well as milk storage guidelines. Mom doesn't have a pump at home, but she participated in the [facility] program and added baby to the program this morning, she's planning on going back to school in 2 weeks and fully pumping.  LC assisted mom with pumping with her hand pump out of the right breast and she was able to get about 5 ml, instructed mom to do the same with the other breast. LC also brought a curved tip syringe in case mom doesn't feel comfortable feeding with a spoon. Reviewed supplementation guidelines for EBM.  Feeding plan:  1. Encouraged mom to feed baby STS 8-12 times/24 hours or sooner if feeding cues are present 2. Hand expression/pumping were also encouraged 3. Mom will spoon/syringe feed baby any amount of EMB she may get  Mom reported all questions and concerns were answered, she's aware of LC services and will call PRN.  Maternal Data    Feeding    Interventions Interventions: Breast feeding basics reviewed;Assisted with latch;Skin to skin;Breast  massage;Hand express;Breast compression;Reverse pressure;Shells;Hand pump;Support pillows;Adjust position  Lactation Tools Discussed/Used Tools: Shells;Pump Shell Type: Inverted Breast pump type: Manual Pump Review: Setup, frequency, and cleaning;Milk Storage Initiated by:: MPeck Date initiated:: 11/20/18   Consult Status Consult Status: Follow-up Date: 11/21/18 Follow-up type: In-patient    Patty Wells 11/20/2018, 8:59 PM

## 2018-11-20 NOTE — Anesthesia Postprocedure Evaluation (Signed)
Anesthesia Post Note  Patient: Patty Wells  Procedure(s) Performed: AN AD HOC LABOR EPIDURAL     Patient location during evaluation: Mother Baby Anesthesia Type: Epidural Level of consciousness: awake Pain management: pain level controlled Vital Signs Assessment: post-procedure vital signs reviewed and stable Respiratory status: spontaneous breathing Cardiovascular status: stable Postop Assessment: epidural receding, patient able to bend at knees and no headache Anesthetic complications: no    Last Vitals:  Vitals:   11/20/18 0857 11/20/18 0903  BP: (!) 123/112 122/81  Pulse: (!) 112 (!) 107  Resp:  16  Temp:      Last Pain:  Vitals:   11/20/18 0913  TempSrc:   PainSc: 0-No pain   Pain Goal:                 [name]

## 2018-11-20 NOTE — Progress Notes (Signed)
Patient ID: Patty Wells, female   DOB: Oct 12, 1996, 23 y.o.   MRN: [id number]  Called to room for Code Hemorrhage. Patient ahs loss at delivery and had additional loss prior to my arrival. Patient was hemodynamically stable with a normal blood pressure and no tachycardia when I arrived. Foley catheter was placed and drained about of urine. I performed a manual extraction of clot from the lower uterine segment several times. QBL at the end of my clot extraction. Patient was given 800mg  Cytotec buccally and pitocin infusion for 1 hr. Uterus was firm and bleeding minimal when I left the room.  I reviewed with patient the events and the plan for antibiotic administration (Ancef) due to manual extraction and repeat CBC this afternoon.   Federico Flake, MD, MPH, ABFM Attending Physician Faculty Practice- Center for [facility]

## 2018-11-20 NOTE — Progress Notes (Signed)
Patient ID: Patty Wells, female   DOB: [birth year], 23 y.o.   MRN: [id number] Doing well  Vitals:   11/20/18 0331 11/20/18 0401 11/20/18 0431 11/20/18 0500  BP: 138/90 132/83 (!) 148/95 (!) 150/90  Pulse: 95 92 88 87  Resp: 17 16 17 17   Temp:      TempSrc:      Weight:      Height:       FHR reactive UCs irregular  Dilation: 10 Dilation Complete Date: 11/20/18 Dilation Complete Time: 0506 Effacement (%): 100 Cervical Position: Middle Station: Plus 1 Presentation: Vertex Exam by:: Jonelle SidleLeah Fails, RN  Anticipate SVD

## 2018-11-21 LAB — CBC
HCT: 25.8 % — ABNORMAL LOW (ref 36.0–46.0)
Hemoglobin: 9 g/dL — ABNORMAL LOW (ref 12.0–15.0)
MCH: 31 pg (ref 26.0–34.0)
MCHC: 34.9 g/dL (ref 30.0–36.0)
MCV: 89 fL (ref 80.0–100.0)
Platelets: 146 10*3/uL — ABNORMAL LOW (ref 150–400)
RBC: 2.9 MIL/uL — ABNORMAL LOW (ref 3.87–5.11)
RDW: 14.1 % (ref 11.5–15.5)
WBC: 9.9 10*3/uL (ref 4.0–10.5)
nRBC: 0 % (ref 0.0–0.2)

## 2018-11-21 NOTE — Progress Notes (Signed)
Post Partum Day 1 from SVD after admission for PROM. Vaginal delivery complicated by PPH of .   Subjective: no complaints, denies SOB, palpitations and dizziness. Tolerating PO. Ambulating independently   Objective: Blood pressure 125/68, pulse 82, temperature 97.8 F (36.6 C), temperature source Oral, resp. rate 16, height 5\' 3"  (1.6 m), weight 113.4 kg, last menstrual period 02/14/2018, SpO2 99 %, unknown if currently breastfeeding.  Physical Exam:  General: alert Lochia: appropriate Uterine Fundus: firm Incision: n/a DVT Evaluation: No evidence of DVT seen on physical exam. Tolerating PO Voiding  Adequately   Results for orders placed or performed during the hospital encounter of 11/19/18 (from the past 24 hour(s))  CBC     Status: Abnormal   Collection Time: 11/20/18  8:50 AM  Result Value Ref Range   WBC 13.1 (H) 4.0 - 10.5 K/uL   RBC 4.11 3.87 - 5.11 MIL/uL   Hemoglobin 12.4 12.0 - 15.0 g/dL   HCT 16.3 84.5 - 36.4 %   MCV 89.5 80.0 - 100.0 fL   MCH 30.2 26.0 - 34.0 pg   MCHC 33.7 30.0 - 36.0 g/dL   RDW 68.0 32.1 - 22.4 %   Platelets 206 150 - 400 K/uL   nRBC 0.1 0.0 - 0.2 %  CBC     Status: Abnormal   Collection Time: 11/21/18  6:05 AM  Result Value Ref Range   WBC 9.9 4.0 - 10.5 K/uL   RBC 2.90 (L) 3.87 - 5.11 MIL/uL   Hemoglobin 9.0 (L) 12.0 - 15.0 g/dL   HCT 82.5 (L) 00.3 - 70.4 %   MCV 89.0 80.0 - 100.0 fL   MCH 31.0 26.0 - 34.0 pg   MCHC 34.9 30.0 - 36.0 g/dL   RDW 88.8 91.6 - 94.5 %   Platelets 146 (L) 150 - 400 K/uL   nRBC 0.0 0.0 - 0.2 %    Assessment/Plan:  22yo G1P1 s/p SVD complicated by PPH Hgb stable ; asymptomatic  Breastfeeding: lactation assisting patient with latch  Plan for discharge tomorrow  LOS: 1 day   Sigurd Sos Kimmarie Pascale 11/21/2018,

## 2018-11-21 NOTE — Lactation Note (Signed)
This note was copied from a baby's chart. Lactation Consultation Note  Patient Name: Patty Wells [id number] Date: 11/21/2018 Reason for consult: Follow-up assessment;Term;Mother's request;Difficult latch P1, 23 hour female infant. Per mom, infant has not been latching to breast. Mom has large breast and nipples that are short shafted and has breast shells, LC reinforced importance of wearing breast shells.  LC asked mom to  hand express and infant was given 9 ml by curve tip syringe, infant was tongue thrusting and biting at first but started suckling in rthymitic pattern.  Mom latched infant to right breast using football hold, after repeated attempts infant latched for 10 minutes. Mom will continue to work towards latching infant to breast and will ask for assistance from Nurse and LC.     Maternal Data Formula Feeding for Exclusion: No  Feeding Feeding Type: Breast Fed  LATCH Score Latch: Repeated attempts needed to sustain latch, nipple held in mouth throughout feeding, stimulation needed to elicit sucking reflex.  Audible Swallowing: A few with stimulation  Type of Nipple: Everted at rest and after stimulation  Comfort (Breast/Nipple): Soft / non-tender  Hold (Positioning): Assistance needed to correctly position infant at breast and maintain latch.  LATCH Score: 7  Interventions Interventions: Assisted with latch;Hand express;Support pillows;Adjust position;Breast compression;Pre-pump if needed;Reverse pressure;Position options;Hand pump  Lactation Tools Discussed/Used     Consult Status Consult Status: Follow-up Date: 11/21/18 Follow-up type: In-patient    Patty Wells 11/21/2018, 7:02 AM

## 2018-11-21 NOTE — Lactation Note (Signed)
This note was copied from a baby's chart. Lactation Consultation Note  Patient Name: Patty Wells [id number] Date: 11/21/2018 Reason for consult: Follow-up assessment Mom feels feedings are going better.  Assisted with positioning baby in football hold.  Mom has an abundant supply of colostrum which was easily hand expressed into baby's mouth.  After a few attempts and good breast compression baby latched well.  Observed baby for 10 minutes with rhythmic suck/swallows.  Mom very pleased.  Instructed to feed with cues and call for assist prn.  Maternal Data    Feeding Feeding Type: Breast Fed  LATCH Score Latch: Grasps breast easily, tongue down, lips flanged, rhythmical sucking.  Audible Swallowing: Spontaneous and intermittent  Type of Nipple: Flat  Comfort (Breast/Nipple): Soft / non-tender  Hold (Positioning): Assistance needed to correctly position infant at breast and maintain latch.  LATCH Score: 8  Interventions Interventions: Assisted with latch;Breast compression;Skin to skin;Adjust position;Breast massage;Support pillows;Hand express  Lactation Tools Discussed/Used     Consult Status Consult Status: Follow-up Date: 11/22/18 Follow-up type: In-patient    Patty Wells 11/21/2018, 3:21 PM

## 2018-11-22 ENCOUNTER — Encounter (HOSPITAL_COMMUNITY): Payer: Self-pay

## 2018-11-22 ENCOUNTER — Ambulatory Visit: Payer: Self-pay

## 2018-11-22 MED ORDER — DOCUSATE SODIUM 100 MG PO CAPS: 100.0000 mg | ORAL_CAPSULE | Freq: Two times a day (BID) | ORAL | 0 refills | Status: AC

## 2018-11-22 MED ORDER — ACETAMINOPHEN 325 MG PO TABS: 650.0000 mg | ORAL_TABLET | ORAL | Status: AC | PRN

## 2018-11-22 MED ORDER — IBUPROFEN 600 MG PO TABS: 600.0000 mg | ORAL_TABLET | Freq: Four times a day (QID) | ORAL | 0 refills | Status: DC | PRN

## 2018-11-22 MED ORDER — FERROUS GLUCONATE 324 (38 FE) MG PO TABS: 324.0000 mg | ORAL_TABLET | Freq: Every day | ORAL | 0 refills | Status: DC

## 2018-11-22 NOTE — Discharge Summary (Signed)
Obstetrical Discharge Summary  Date of Admission: 11/19/2018 Date of Discharge: 11/22/2018  Primary OB: [facility]  Gestational Age at Delivery: [redacted]w[redacted]d   Antepartum complications: BMI 40s Reason for Admission: PROM Date of Delivery: 11/20/2017  Delivered By: Wynelle Bourgeois CNM Delivery Type: spontaneous vaginal delivery Intrapartum complications/course: None Anesthesia: epidural Placenta: Delivered and expressed via active management. Intact: yes. To pathology: yes.  Laceration: 1st degree (repaired) Episiotomy: none EBL: Baby: Liveborn female, APGARs 9/9, weight 3195 g.    Discharge Diagnosis: Delivered.  Postpartum course: Delayed PPH of a few hours after delivery due to clots in lower uterine segment. Ancef given. Rest of PP course routine.  Discharge Vital Signs:  Current Vital Signs 24h Vital Sign Ranges  T 98 F (36.7 C) Temp  Avg: 97.8 F (36.6 C)  Min: 97.3 F (36.3 C)  Max: 98.1 F (36.7 C)  BP 116/76 BP  Min: 115/74  Max: 129/78  HR 94 Pulse  Avg: 100.3  Min: 93  Max: 114  RR 18 Resp  Avg: 16.7  Min: 16  Max: 18  SaO2 100 % Room Air SpO2  Avg: 100 %  Min: 100 %  Max: 100 %       24 Hour I/O Current Shift I/O  Time Ins Outs No intake/output data recorded. No intake/output data recorded.   Discharge Exam:  NAD Perineum: deferred Abdomen: firm fundus below the umbilicus, NTTP, non distended, +bowel sounds.  RRR no MRGs CTAB Ext: no c/c/e  Recent Labs  Lab 11/19/18 1439 11/20/18 0850 11/21/18 0605  WBC 8.5 13.1* 9.9  HGB 13.6 12.4 9.0*  HCT 39.1 36.8 25.8*  PLT 191 206 146*    Disposition: Home  Rh Immune globulin given: not applicable Rubella vaccine given: not applicable  Contraception: undecided  Breastfeeding: Yes   Plan:  Patty Wells was discharged to home in good condition. Follow-up appointment with [facility] in 4 weeks for a postpartum visit  No future appointments.  Discharge Medications: Allergies as of 11/22/2018    No Known Allergies     Medication List    STOP taking these medications   amoxicillin 500 MG capsule Commonly known as:  AMOXIL     TAKE these medications   acetaminophen 325 MG tablet Commonly known as:  TYLENOL Take 2 tablets (650 mg total) by mouth every 4 (four) hours as needed (for pain scale < 4).   docusate sodium 100 MG capsule Commonly known as:  COLACE Take 1 capsule (100 mg total) by mouth 2 (two) times daily.   ferrous gluconate 324 MG tablet Commonly known as:  FERGON Take 1 tablet (324 mg total) by mouth daily with breakfast.   ibuprofen 600 MG tablet Commonly known as:  ADVIL,MOTRIN Take 1 tablet (600 mg total) by mouth every 6 (six) hours as needed.   PNV PO Take 1 tablet by mouth daily.       Cornelia Copa MD Attending Center for [facility])

## 2018-11-22 NOTE — Discharge Instructions (Signed)
Vaginal Delivery, Care After Refer to this sheet in the next few weeks. These discharge instructions provide you with information on caring for yourself after delivery. Your caregiver may also give you specific instructions. Your treatment has been planned according to the most current medical practices available, but problems sometimes occur. Call your caregiver if you have any problems or questions after you go home. HOME CARE INSTRUCTIONS 1. Take over-the-counter or prescription medicines only as directed by your caregiver or pharmacist. 2. Do not drink alcohol, especially if you are breastfeeding or taking medicine to relieve pain. 3. Do not smoke tobacco. 4. Continue to use good perineal care. Good perineal care includes: 1. Wiping your perineum from back to front 2. Keeping your perineum clean. 3. You can do sitz baths twice a day, to help keep this area clean 5. Do not use tampons, douche or have sex until your caregiver says it is okay. 6. Shower only and avoid sitting in submerged water, aside from sitz baths 7. Wear a well-fitting bra that provides breast support. 8. Eat healthy foods. 9. Drink enough fluids to keep your urine clear or pale yellow. 10. Eat high-fiber foods such as whole grain cereals and breads, brown rice, beans, and fresh fruits and vegetables every day. These foods may help prevent or relieve constipation. 11. Avoid constipation with high fiber foods or medications, such as miralax or metamucil 12. Follow your caregiver's recommendations regarding resumption of activities such as climbing stairs, driving, lifting, exercising, or traveling. 13. Talk to your caregiver about resuming sexual activities. Resumption of sexual activities is dependent upon your risk of infection, your rate of healing, and your comfort and desire to resume sexual activity. 14. Try to have someone help you with your household activities and your newborn for at least a few days after you leave  the hospital. 15. Rest as much as possible. Try to rest or take a nap when your newborn is sleeping. 16. Increase your activities gradually. 17. Keep all of your scheduled postpartum appointments. It is very important to keep your scheduled follow-up appointments. At these appointments, your caregiver will be checking to make sure that you are healing physically and emotionally. SEEK MEDICAL CARE IF:   You are passing large clots from your vagina. Save any clots to show your caregiver.  You have a foul smelling discharge from your vagina.  You have trouble urinating.  You are urinating frequently.  You have pain when you urinate.  You have a change in your bowel movements.  You have increasing redness, pain, or swelling near your vaginal incision (episiotomy) or vaginal tear.  You have pus draining from your episiotomy or vaginal tear.  Your episiotomy or vaginal tear is separating.  You have painful, hard, or reddened breasts.  You have a severe headache.  You have blurred vision or see spots.  You feel sad or depressed.  You have thoughts of hurting yourself or your newborn.  You have questions about your care, the care of your newborn, or medicines.  You are dizzy or light-headed.  You have a rash.  You have nausea or vomiting.  You were breastfeeding and have not had a menstrual period within 12 weeks after you stopped breastfeeding.  You are not breastfeeding and have not had a menstrual period by the 12th week after delivery.  You have a fever. SEEK IMMEDIATE MEDICAL CARE IF:   You have persistent pain.  You have chest pain.  You have shortness of breath.    You faint.  You have leg pain.  You have stomach pain.  Your vaginal bleeding saturates two or more sanitary pads in 1 hour. MAKE SURE YOU:   Understand these instructions.  Will watch your condition.  Will get help right away if you are not doing well or get worse. Document Released:  10/27/2000 Document Revised: 03/16/2014 Document Reviewed: 06/26/2012 ExitCare Patient Information 2015 [employer]. This information is not intended to replace advice given to you by your health care provider. Make sure you discuss any questions you have with your health care provider.  Sitz Bath A sitz bath is a warm water bath taken in the sitting position. The water covers only the hips and butt (buttocks). We recommend using one that fits in the toilet, to help with ease of use and cleanliness. It may be used for either healing or cleaning purposes. Sitz baths are also used to relieve pain, itching, or muscle tightening (spasms). The water may contain medicine. Moist heat will help you heal and relax.  HOME CARE  Take 3 to 4 sitz baths a day. 18. Fill the bathtub half-full with warm water. 19. Sit in the water and open the drain a little. 20. Turn on the warm water to keep the tub half-full. Keep the water running constantly. 21. Soak in the water for 15 to 20 minutes. 22. After the sitz bath, pat the affected area dry. GET HELP RIGHT AWAY IF: You get worse instead of better. Stop the sitz baths if you get worse. MAKE SURE YOU:  Understand these instructions.  Will watch your condition.  Will get help right away if you are not doing well or get worse. Document Released: 12/07/2004 Document Revised: 07/24/2012 Document Reviewed: 02/27/2011 ExitCare Patient Information 2015 [employer]. This information is not intended to replace advice given to you by your health care provider. Make sure you discuss any questions you have with your health care provider.    

## 2018-11-22 NOTE — Lactation Note (Signed)
This note was copied from a baby's chart. Lactation Consultation Note: infant is 53 hours old. Mother reports that infant fed for 2 hours on and off.  Mother reports that infant is feeding better. Mother denies having any nipple discomfort when infant is feeding.   She is starting to fill full. Mother is able to hand express large amts of colostrum.  Mother assist with latching infant on the rt breast  In football hold.  Infant sustained latch for 10 mins. Assist with adjusting infants lower jaw for wider gape.  Infant placed in cross cradle hold. Infant on and off for 10 mins.  Infant continued to cue . Infant switched to alternate breast, repeated attempts to latch infant.  Mothers nipples are semi-flat but compressible. Infant unable to sustain latch.   Mother was fit with a #24 nipple shield. Mother taught proper application of the nipple shield . Advised mother in care of nipple shield.  Infant latched on with good depth, infants lips wide and flanged.   Mother is active with WIC and plans to go back to school in 2 weeks. She reports that she has an appt with WIC. Mother advised to phone [facility] and request earlier appt to get a pump.   Mother has a harmony hand pump and was given with a #27 flange.  Advised mother to post pump for 15 mins on each breast.  Discussed getting a good support bra and wearing shells.  Discussed treatment and prevention of engorgement.   Mother advised to follow up with [facility] services at [facility] and community support. Mother has all phone numbers for [facility] support.    Patient Name: Patty Wells [id number] Date: 11/22/2018     Maternal Data    Feeding    LATCH Score                   Interventions    Lactation Tools Discussed/Used     Consult Status      Stevan Born [facility] 11/22/2018, 3:15 PM

## 2018-11-23 ENCOUNTER — Emergency Department (HOSPITAL_BASED_OUTPATIENT_CLINIC_OR_DEPARTMENT_OTHER)
Admit: 2018-11-23 | Discharge: 2018-11-23 | Disposition: A | Discharge status: Home / Self Care | Payer: Medicaid Other | Attending: Emergency Medicine | Admitting: Emergency Medicine

## 2018-11-23 ENCOUNTER — Encounter (HOSPITAL_COMMUNITY): Payer: Self-pay | Admitting: Emergency Medicine

## 2018-11-23 ENCOUNTER — Emergency Department (HOSPITAL_COMMUNITY)
Admission: EM | Admit: 2018-11-23 | Discharge: 2018-11-23 | Disposition: A | Discharge status: Home / Self Care | Payer: Medicaid Other | Attending: Emergency Medicine | Admitting: Emergency Medicine

## 2018-11-23 DIAGNOSIS — R55 Syncope and collapse: Secondary | ICD-10-CM | POA: Diagnosis not present

## 2018-11-23 DIAGNOSIS — D649 Anemia, unspecified: Secondary | ICD-10-CM | POA: Diagnosis not present

## 2018-11-23 DIAGNOSIS — M79605 Pain in left leg: Secondary | ICD-10-CM | POA: Diagnosis not present

## 2018-11-23 DIAGNOSIS — R52 Pain, unspecified: Secondary | ICD-10-CM | POA: Diagnosis not present

## 2018-11-23 DIAGNOSIS — M79604 Pain in right leg: Secondary | ICD-10-CM | POA: Diagnosis not present

## 2018-11-23 DIAGNOSIS — Z79899 Other long term (current) drug therapy: Secondary | ICD-10-CM | POA: Insufficient documentation

## 2018-11-23 DIAGNOSIS — R531 Weakness: Secondary | ICD-10-CM | POA: Diagnosis present

## 2018-11-23 DIAGNOSIS — M7989 Other specified soft tissue disorders: Secondary | ICD-10-CM

## 2018-11-23 LAB — CBC
HCT: 28.1 % — ABNORMAL LOW (ref 36.0–46.0)
Hemoglobin: 9.4 g/dL — ABNORMAL LOW (ref 12.0–15.0)
MCH: 30.1 pg (ref 26.0–34.0)
MCHC: 33.5 g/dL (ref 30.0–36.0)
MCV: 90.1 fL (ref 80.0–100.0)
Platelets: 235 10*3/uL (ref 150–400)
RBC: 3.12 MIL/uL — ABNORMAL LOW (ref 3.87–5.11)
RDW: 13.8 % (ref 11.5–15.5)
WBC: 6.7 10*3/uL (ref 4.0–10.5)
nRBC: 0 % (ref 0.0–0.2)

## 2018-11-23 LAB — URINALYSIS, ROUTINE W REFLEX MICROSCOPIC
Bacteria, UA: NONE SEEN
Bilirubin Urine: NEGATIVE
Glucose, UA: NEGATIVE mg/dL
Ketones, ur: NEGATIVE mg/dL
Nitrite: NEGATIVE
Protein, ur: NEGATIVE mg/dL
Specific Gravity, Urine: 1.005 (ref 1.005–1.030)
pH: 7 (ref 5.0–8.0)

## 2018-11-23 LAB — BASIC METABOLIC PANEL
Anion gap: 10 (ref 5–15)
BUN: 7 mg/dL (ref 6–20)
CHLORIDE: 107 mmol/L (ref 98–111)
CO2: 24 mmol/L (ref 22–32)
Calcium: 8.4 mg/dL — ABNORMAL LOW (ref 8.9–10.3)
Creatinine, Ser: 0.8 mg/dL (ref 0.44–1.00)
GFR calc Af Amer: >60 mL/min (ref >60)
GFR calc non Af Amer: >60 mL/min (ref >60)
Glucose, Bld: 81 mg/dL (ref 70–99)
Potassium: 3.5 mmol/L (ref 3.5–5.1)
Sodium: 141 mmol/L (ref 135–145)

## 2018-11-23 LAB — CBG MONITORING, ED: Glucose-Capillary: 90 mg/dL (ref 70–99)

## 2018-11-23 MED ORDER — SODIUM CHLORIDE 0.9 % IV BOLUS
1000.0000 mL | Freq: Once | INTRAVENOUS | Status: AC
  Administered 2018-11-23: 1000 mL via INTRAVENOUS

## 2018-11-23 MED ORDER — KETOROLAC TROMETHAMINE 30 MG/ML IJ SOLN
30.0000 mg | Freq: Once | INTRAMUSCULAR | Status: AC
  Administered 2018-11-23: 30 mg via INTRAVENOUS
  Filled 2018-11-23: qty 1

## 2018-11-23 NOTE — Progress Notes (Signed)
VASCULAR LAB PRELIMINARY  PRELIMINARY  PRELIMINARY  PRELIMINARY  Bilateral lower extremity venous duplex completed.    Preliminary report:  There is no obvious evidence of DVT or SVT noted in the bilateral lower extremities.   Gave results to Dr. Trude Mcburney, [facility], RVT 11/23/2018, 6:51 PM

## 2018-11-23 NOTE — ED Triage Notes (Signed)
Patient here from home with complaints of "feeling faint". Reports that she had a recent delivery 3 days ago. States that she now has bilateral leg pain. "I worried about DVT". States that she also had postpartum hemorrhage, but no heavy bleeding now.

## 2018-11-23 NOTE — ED Provider Notes (Signed)
[facility]-EMERGENCY DEPT Provider Note   CSN: [id number] Arrival date & time: 11/23/18  1554     History   Chief Complaint Chief Complaint  Patient presents with  . Near Syncope  . Leg Pain    HPI Patty Wells is a 23 y.o. female.  Pt presents to the ED today feeling weak.  She had a baby vaginally on 11/19/18.  She did have a postpartum hemorrhage after delivery.  Pt said bleeding has slowed down a lot.  She is breast feeding.  She developed pain in her legs today and was worried about a blood clot.     Past Medical History:  Diagnosis Date  . Medical history non-contributory     Patient Active Problem List   Diagnosis Date Noted  . Postpartum hemorrhage 11/20/2018  . NSVD (normal spontaneous vaginal delivery) 11/19/2018    Past Surgical History:  Procedure Laterality Date  . NO PAST SURGERIES       OB History    Gravida  1   Para  1   Term  1   Preterm      AB      Living  1     SAB      TAB      Ectopic      Multiple  0   Live Births  1            Home Medications    Prior to Admission medications   Medication Sig Start Date End Date Taking? Authorizing Provider  acetaminophen (TYLENOL) 325 MG tablet Take 2 tablets (650 mg total) by mouth every 4 (four) hours as needed (for pain scale < 4). 11/22/18  Yes [place] Bing, MD  amoxicillin (AMOXIL) 500 MG tablet Take 500 mg by mouth 3 (three) times daily.   Yes [provider]  docusate sodium (COLACE) 100 MG capsule Take 1 capsule (100 mg total) by mouth 2 (two) times daily. 11/22/18 12/22/18 Yes [place] Bing, MD  ferrous gluconate (FERGON) 324 MG tablet Take 1 tablet (324 mg total) by mouth daily with breakfast. 11/22/18 12/22/18 Yes [place] Bing, MD  Prenatal Vit w/Fe-Methylfol-FA (PNV PO) Take 1 tablet by mouth daily.    Yes [provider]  ibuprofen (ADVIL,MOTRIN) 600 MG tablet Take 1 tablet (600 mg total) by mouth every 6 (six) hours as  needed. Patient not taking: Reported on 11/23/2018 11/22/18   [place] Bing, MD    Family History Family History  Problem Relation Age of Onset  . Hypertension Mother   . Diabetes Maternal Grandmother   . Diabetes Paternal Grandmother     Social History Social History   Tobacco Use  . Smoking status: Never Smoker  . Smokeless tobacco: Never Used  Substance Use Topics  . Alcohol use: Never    Frequency: Never  . Drug use: Not Currently    Types: Marijuana    Comment: Not during pregnancy     Allergies   Patient has no known allergies.   Review of Systems Review of Systems  Musculoskeletal:       Bilateral leg pain  Neurological: Positive for weakness.  All other systems reviewed and are negative.    Physical Exam Updated Vital Signs BP 139/89 (BP Location: Left Arm)   Pulse (!) 104   Temp 97.6 F (36.4 C) (Oral)   Resp 19   Ht 5\' 3"  (1.6 m)   Wt 107.5 kg   LMP 02/14/2018   SpO2 100%  Breastfeeding Yes   BMI 41.98 kg/m   Physical Exam Vitals signs and nursing note reviewed.  Constitutional:      Appearance: Normal appearance.  HENT:     Head: Normocephalic and atraumatic.     Right Ear: External ear normal.     Left Ear: External ear normal.     Nose: Nose normal.     Mouth/Throat:     Mouth: Mucous membranes are moist.     Pharynx: Oropharynx is clear.  Eyes:     Extraocular Movements: Extraocular movements intact.     Conjunctiva/sclera: Conjunctivae normal.     Pupils: Pupils are equal, round, and reactive to light.  Neck:     Musculoskeletal: Normal range of motion and neck supple.  Cardiovascular:     Rate and Rhythm: Normal rate and regular rhythm.     Pulses: Normal pulses.     Heart sounds: Normal heart sounds.  Pulmonary:     Effort: Pulmonary effort is normal.     Breath sounds: Normal breath sounds.  Abdominal:     Palpations: Abdomen is soft.  Musculoskeletal:     Right lower leg: Edema present.     Left lower leg: Edema  present.  Skin:    General: Skin is warm.     Capillary Refill: Capillary refill takes less than 2 seconds.  Neurological:     General: No focal deficit present.     Mental Status: She is alert and oriented to person, place, and time.  Psychiatric:        Mood and Affect: Mood normal.        Behavior: Behavior normal.      ED Treatments / Results  Labs (all labs ordered are listed, but only abnormal results are displayed) Labs Reviewed  BASIC METABOLIC PANEL - Abnormal; Notable for the following components:      Result Value   Calcium 8.4 (*)    All other components within normal limits  CBC - Abnormal; Notable for the following components:   RBC 3.12 (*)    Hemoglobin 9.4 (*)    HCT 28.1 (*)    All other components within normal limits  URINALYSIS, ROUTINE W REFLEX MICROSCOPIC - Abnormal; Notable for the following components:   Color, Urine STRAW (*)    Hgb urine dipstick MODERATE (*)    Leukocytes, UA TRACE (*)    All other components within normal limits  CBG MONITORING, ED    EKG EKG Interpretation  Date/Time:  Saturday November 23 2018 16:05:44 EST Ventricular Rate:  95 PR Interval:    QRS Duration: 87 QT Interval:  329 QTC Calculation: 414 R Axis:   19 Text Interpretation:  Sinus rhythm No old tracing to compare Confirmed by Jacalyn LefevreHaviland, Khamila Bassinger [phone]([id number]) on 11/23/2018 4:09:57 PM   Radiology No results found.  Procedures Procedures (including critical care time)  Medications Ordered in ED Medications  sodium chloride 0.9 % bolus 1,000 mL (1,000 mLs Intravenous New Bag/Given 11/23/18 1635)  ketorolac (TORADOL) 30 MG/ML injection 30 mg (30 mg Intravenous Given 11/23/18 1649)     Initial Impression / Assessment and Plan / ED Course  I have reviewed the triage vital signs and the nursing notes.  Pertinent labs & imaging results that were available during my care of the patient were reviewed by me and considered in my medical decision making (see chart for  details).    Bilateral LE US negative for DVT.  Hgb is still low, but has improved  since d/c.  Pt is breast feeding, so is encouraged to drink lots of water while breast feeding.  She knows to return if worse.  Final Clinical Impressions(s) / ED Diagnoses   Final diagnoses:  Near syncope  Anemia, unspecified type    ED Discharge Orders    None       Jacalyn LefevreHaviland, Ayden Hardwick, MD 11/23/18 [phone]

## 2018-12-28 ENCOUNTER — Other Ambulatory Visit: Payer: Self-pay

## 2018-12-28 ENCOUNTER — Encounter (HOSPITAL_COMMUNITY): Payer: Self-pay

## 2018-12-28 ENCOUNTER — Emergency Department (HOSPITAL_COMMUNITY)
Admission: EM | Admit: 2018-12-28 | Discharge: 2018-12-29 | Disposition: A | Discharge status: Home / Self Care | Payer: Medicaid Other | Attending: Emergency Medicine | Admitting: Emergency Medicine

## 2018-12-28 DIAGNOSIS — Z79899 Other long term (current) drug therapy: Secondary | ICD-10-CM | POA: Diagnosis not present

## 2018-12-28 DIAGNOSIS — K0889 Other specified disorders of teeth and supporting structures: Secondary | ICD-10-CM | POA: Diagnosis present

## 2018-12-28 MED ORDER — AMOXICILLIN 500 MG PO CAPS
500.0000 mg | ORAL_CAPSULE | Freq: Once | ORAL | Status: AC
  Administered 2018-12-28: 500 mg via ORAL
  Filled 2018-12-28: qty 1

## 2018-12-28 MED ORDER — AMOXICILLIN 500 MG PO CAPS: 500.0000 mg | ORAL_CAPSULE | Freq: Three times a day (TID) | ORAL | 0 refills | Status: DC

## 2018-12-28 NOTE — ED Triage Notes (Signed)
Pt was diagnosed with dental infection and given an antibiotic about 2 weeks ago and was told that she needed to get her tooth removed but was unable to because insurance did not cover the cost. Pt reports that she feels like the infection has come back. Pt is requesting more antibiotics and something for pain control.

## 2018-12-28 NOTE — ED Provider Notes (Signed)
[facility]   CSN: [id number] Arrival date & time: 12/28/18  2214     History   Chief Complaint Chief Complaint  Patient presents with  . Dental Pain    HPI Patty Wells is a 23 y.o. female.  Patient presents to the ED with a chief complaint of dental pain.  She states that she has a broken tooth and is concerned that it is getting infected.  She was recently treated with amox and had good improvement, but when she went to get the tooth extracted, she found out she had lost her insurance.   She states that she is scheduled to finally have it taken out on 2/27.  She denies any fever.  Denies any other associated symptoms.  The history is provided by the patient. No language interpreter was used.    Past Medical History:  Diagnosis Date  . Medical history non-contributory     Patient Active Problem List   Diagnosis Date Noted  . Postpartum hemorrhage 11/20/2018  . NSVD (normal spontaneous vaginal delivery) 11/19/2018    Past Surgical History:  Procedure Laterality Date  . NO PAST SURGERIES       OB History    Gravida  1   Para  1   Term  1   Preterm      AB      Living  1     SAB      TAB      Ectopic      Multiple  0   Live Births  1            Home Medications    Prior to Admission medications   Medication Sig Start Date End Date Taking? Authorizing Provider  acetaminophen (TYLENOL) 325 MG tablet Take 2 tablets (650 mg total) by mouth every 4 (four) hours as needed (for pain scale < 4). 11/22/18   [place] Bing, MD  amoxicillin (AMOXIL) 500 MG capsule Take 1 capsule (500 mg total) by mouth 3 (three) times daily. 12/28/18   Roxy Horseman, PA-C  ferrous gluconate (FERGON) 324 MG tablet Take 1 tablet (324 mg total) by mouth daily with breakfast. 11/22/18 12/22/18  [place] Bing, MD  ibuprofen (ADVIL,MOTRIN) 600 MG tablet Take 1 tablet (600 mg total) by mouth every 6 (six) hours as  needed. Patient not taking: Reported on 11/23/2018 11/22/18   [place] Bing, MD  Prenatal Vit w/Fe-Methylfol-FA (PNV PO) Take 1 tablet by mouth daily.     [provider]    Family History Family History  Problem Relation Age of Onset  . Hypertension Mother   . Diabetes Maternal Grandmother   . Diabetes Paternal Grandmother     Social History Social History   Tobacco Use  . Smoking status: Never Smoker  . Smokeless tobacco: Never Used  Substance Use Topics  . Alcohol use: Never    Frequency: Never  . Drug use: Not Currently    Types: Marijuana    Comment: Not during pregnancy     Allergies   Patient has no known allergies.   Review of Systems Review of Systems  All other systems reviewed and are negative.    Physical Exam Updated Vital Signs BP (!) 130/94 (BP Location: Right Arm)   Pulse 66   Temp 98.4 F (36.9 C) (Oral)   Resp 18   Ht 5\' 4"  (1.626 m)   Wt 90.7 kg   LMP 02/14/2018   SpO2 98%  BMI 34.33 kg/m   Physical Exam Physical Exam  Constitutional: Pt appears well-developed and well-nourished.  HENT:  Head: Normocephalic.  Right Ear: Tympanic membrane, external ear and ear canal normal.  Left Ear: Tympanic membrane, external ear and ear canal normal.  Nose: Nose normal. Right sinus exhibits no maxillary sinus tenderness and no frontal sinus tenderness. Left sinus exhibits no maxillary sinus tenderness and no frontal sinus tenderness.  Mouth/Throat: Uvula is midline, oropharynx is clear and moist and mucous membranes are normal. No oral lesions. No uvula swelling or lacerations. No oropharyngeal exudate, posterior oropharyngeal edema, posterior oropharyngeal erythema or tonsillar abscesses.  Poor dentition No gingival swelling, fluctuance or induration No gross abscess  No sublingual edema, tenderness to palpation, or sign of Ludwig's angina, or deep space infection Pain at right upper rear molar Eyes: Conjunctivae are normal. Pupils  are equal, round, and reactive to light. Right eye exhibits no discharge. Left eye exhibits no discharge.  Neck: Normal range of motion. Neck supple.  No stridor Handling secretions without difficulty No nuchal rigidity No cervical lymphadenopathy Cardiovascular: Normal rate, regular rhythm and normal heart sounds.   Pulmonary/Chest: Effort normal. No respiratory distress.  Equal chest rise  Abdominal: Soft. Bowel sounds are normal. Pt exhibits no distension. There is no tenderness.  Lymphadenopathy: Pt has no cervical adenopathy.  Neurological: Pt is alert and oriented x 4  Skin: Skin is warm and dry.  Psychiatric: Pt has a normal mood and affect.  Nursing note and vitals reviewed.    ED Treatments / Results  Labs (all labs ordered are listed, but only abnormal results are displayed) Labs Reviewed - No data to display  EKG None  Radiology No results found.  Procedures Procedures (including critical care time)  Medications Ordered in ED Medications  amoxicillin (AMOXIL) capsule 500 mg (has no administration in time range)     Initial Impression / Assessment and Plan / ED Course  I have reviewed the triage vital signs and the nursing notes.  Pertinent labs & imaging results that were available during my care of the patient were reviewed by me and considered in my medical decision making (see chart for details).     Patient with dentalgia.  No abscess requiring immediate incision and drainage.  Exam not concerning for Ludwig's angina or pharyngeal abscess.  Will treat with amox. Pt instructed to follow-up with dentist.  Discussed return precautions. Pt safe for discharge.   Final Clinical Impressions(s) / ED Diagnoses   Final diagnoses:  Pain, dental    ED Discharge Orders         Ordered    amoxicillin (AMOXIL) 500 MG capsule  3 times daily     12/28/18 2341           Roxy Horseman, PA-C 12/28/18 2344    Devoria Albe, MD 12/29/18 [phone]

## 2019-10-14 ENCOUNTER — Other Ambulatory Visit: Payer: Self-pay

## 2019-10-15 ENCOUNTER — Ambulatory Visit: Payer: Medicaid Other | Admitting: Family Medicine

## 2019-11-18 ENCOUNTER — Encounter: Payer: Self-pay | Admitting: Family Medicine

## 2019-11-18 ENCOUNTER — Encounter: Payer: Medicaid Other | Admitting: Advanced Practice Midwife

## 2020-04-08 ENCOUNTER — Ambulatory Visit (HOSPITAL_COMMUNITY)
Admission: EM | Admit: 2020-04-08 | Discharge: 2020-04-08 | Disposition: A | Discharge status: Home / Self Care | Payer: Medicaid Other | Attending: Urgent Care | Admitting: Urgent Care

## 2020-04-08 ENCOUNTER — Other Ambulatory Visit: Payer: Self-pay

## 2020-04-08 ENCOUNTER — Encounter (HOSPITAL_COMMUNITY): Payer: Self-pay

## 2020-04-08 DIAGNOSIS — N898 Other specified noninflammatory disorders of vagina: Secondary | ICD-10-CM | POA: Insufficient documentation

## 2020-04-08 DIAGNOSIS — Z3202 Encounter for pregnancy test, result negative: Secondary | ICD-10-CM

## 2020-04-08 DIAGNOSIS — N760 Acute vaginitis: Secondary | ICD-10-CM | POA: Diagnosis present

## 2020-04-08 DIAGNOSIS — B9689 Other specified bacterial agents as the cause of diseases classified elsewhere: Secondary | ICD-10-CM | POA: Insufficient documentation

## 2020-04-08 LAB — POC URINE PREG, ED: Preg Test, Ur: NEGATIVE

## 2020-04-08 MED ORDER — CLINDAMYCIN HCL 300 MG PO CAPS: 300.0000 mg | ORAL_CAPSULE | Freq: Two times a day (BID) | ORAL | 0 refills | Status: DC

## 2020-04-08 NOTE — ED Triage Notes (Signed)
Pt presents with abnormal vaginal discharge and vaginal odor X 2 weeks.

## 2020-04-08 NOTE — ED Provider Notes (Signed)
[facility]   MRN: [id number] DOB: 11-02-1996  Subjective:   Patty Wells is a 24 y.o. female presenting for 2-week history of malodorous vaginal discharge.  Patient has a history of BV infection.  Last episode of BV was about 6 months ago, treated with Flagyl.  Denies fever, nausea, vomiting, pelvic pain, belly pain, dysuria, hematuria, urinary frequency.   No current facility-administered medications for this encounter.  Current Outpatient Medications:  .  acetaminophen (TYLENOL) 325 MG tablet, Take 2 tablets (650 mg total) by mouth every 4 (four) hours as needed (for pain scale < 4)., Disp: , Rfl:  .  amoxicillin (AMOXIL) 500 MG capsule, Take 1 capsule (500 mg total) by mouth 3 (three) times daily., Disp: 21 capsule, Rfl: 0 .  ferrous gluconate (FERGON) 324 MG tablet, Take 1 tablet (324 mg total) by mouth daily with breakfast., Disp: 30 tablet, Rfl: 0 .  ibuprofen (ADVIL,MOTRIN) 600 MG tablet, Take 1 tablet (600 mg total) by mouth every 6 (six) hours as needed. (Patient not taking: Reported on 11/23/2018), Disp: 30 tablet, Rfl: 0 .  Prenatal Vit w/Fe-Methylfol-FA (PNV PO), Take 1 tablet by mouth daily. , Disp: , Rfl:    No Known Allergies  Past Medical History:  Diagnosis Date  . Medical history non-contributory      Past Surgical History:  Procedure Laterality Date  . NO PAST SURGERIES      Family History  Problem Relation Age of Onset  . Hypertension Mother   . Diabetes Maternal Grandmother   . Diabetes Paternal Grandmother     Social History   Tobacco Use  . Smoking status: Never Smoker  . Smokeless tobacco: Never Used  Substance Use Topics  . Alcohol use: Never  . Drug use: Not Currently    Types: Marijuana    Comment: Not during pregnancy    ROS   Objective:   Vitals: BP 113/77 (BP Location: Right Arm)   Pulse 93   Temp 98.6 F (37 C) (Oral)   Resp 17   LMP 03/27/2020   SpO2 99%   Physical Exam Constitutional:      General: She is  not in acute distress.    Appearance: Normal appearance. She is well-developed. She is obese. She is not ill-appearing, toxic-appearing or diaphoretic.  HENT:     Head: Normocephalic and atraumatic.     Nose: Nose normal.     Mouth/Throat:     Mouth: Mucous membranes are moist.     Pharynx: Oropharynx is clear.  Eyes:     General: No scleral icterus.    Extraocular Movements: Extraocular movements intact.     Pupils: Pupils are equal, round, and reactive to light.  Cardiovascular:     Rate and Rhythm: Normal rate.  Pulmonary:     Effort: Pulmonary effort is normal.  Skin:    General: Skin is warm and dry.  Neurological:     General: No focal deficit present.     Mental Status: She is alert and oriented to person, place, and time.  Psychiatric:        Mood and Affect: Mood normal.        Behavior: Behavior normal.        Thought Content: Thought content normal.        Judgment: Judgment normal.     Results for orders placed or performed during the hospital encounter of 04/08/20 (from the past 24 hour(s))  POC urine pregnancy     Status: None  Collection Time: 04/08/20  4:29 PM  Result Value Ref Range   Preg Test, Ur NEGATIVE NEGATIVE    Assessment and Plan :   PDMP not reviewed this encounter.  1. Vaginal discharge   2. 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  Labs pending. Counseled patient on potential for adverse effects with medications prescribed/recommended today, ER and return-to-clinic precautions discussed, patient verbalized understanding.    Jaynee Eagles, [place] 04/08/20 1702

## 2020-04-09 LAB — CERVICOVAGINAL ANCILLARY ONLY
Bacterial Vaginitis (gardnerella): POSITIVE — AB
Candida Glabrata: NEGATIVE
Candida Vaginitis: NEGATIVE
Chlamydia: NEGATIVE
Comment: NEGATIVE
Comment: NEGATIVE
Comment: NEGATIVE
Comment: NEGATIVE
Comment: NEGATIVE
Comment: NORMAL
Neisseria Gonorrhea: NEGATIVE
Trichomonas: NEGATIVE

## 2020-04-28 ENCOUNTER — Ambulatory Visit: Payer: Self-pay

## 2020-06-05 ENCOUNTER — Encounter (HOSPITAL_COMMUNITY): Payer: Self-pay | Admitting: Emergency Medicine

## 2020-06-05 ENCOUNTER — Ambulatory Visit (HOSPITAL_COMMUNITY)
Admission: EM | Admit: 2020-06-05 | Discharge: 2020-06-05 | Disposition: A | Discharge status: Home / Self Care | Payer: No Typology Code available for payment source | Attending: Internal Medicine | Admitting: Internal Medicine

## 2020-06-05 ENCOUNTER — Other Ambulatory Visit: Payer: Self-pay

## 2020-06-05 DIAGNOSIS — N898 Other specified noninflammatory disorders of vagina: Secondary | ICD-10-CM | POA: Diagnosis present

## 2020-06-05 DIAGNOSIS — Z3202 Encounter for pregnancy test, result negative: Secondary | ICD-10-CM | POA: Diagnosis not present

## 2020-06-05 DIAGNOSIS — Z113 Encounter for screening for infections with a predominantly sexual mode of transmission: Secondary | ICD-10-CM

## 2020-06-05 LAB — POCT URINALYSIS DIP (DEVICE)
Bilirubin Urine: NEGATIVE
Glucose, UA: NEGATIVE mg/dL
Hgb urine dipstick: NEGATIVE
Ketones, ur: NEGATIVE mg/dL
Leukocytes,Ua: NEGATIVE
Nitrite: NEGATIVE
Protein, ur: NEGATIVE mg/dL
Specific Gravity, Urine: 1.025 (ref 1.005–1.030)
Urobilinogen, UA: 1 mg/dL (ref 0.0–1.0)
pH: 7 (ref 5.0–8.0)

## 2020-06-05 LAB — POC URINE PREG, ED: Preg Test, Ur: NEGATIVE

## 2020-06-05 MED ORDER — METRONIDAZOLE 500 MG PO TABS
500.0000 mg | ORAL_TABLET | Freq: Two times a day (BID) | ORAL | 0 refills | Status: AC
Start: 2020-06-05 — End: 2020-06-12

## 2020-06-05 NOTE — ED Triage Notes (Signed)
Pt c/o vaginal discharge and odor x 5 days. Pt would like to be checked for STDs.

## 2020-06-05 NOTE — Discharge Instructions (Signed)
Begin metronidazole twice daily x 1 week for BV  We are testing you for Gonorrhea, Chlamydia, Trichomonas, Yeast and Bacterial Vaginosis. We will call you if anything is positive and let you know if you require any further treatment. Please inform partners of any positive results.   Please return if symptoms not improving with treatment, development of fever, nausea, vomiting, abdominal pain.

## 2020-06-06 NOTE — ED Provider Notes (Signed)
[facility]    CSN: [id number] Arrival date & time: 06/05/20  1703      History   Chief Complaint Chief Complaint  Patient presents with  . SEXUALLY TRANSMITTED DISEASE    HPI Patty Wells is a 24 y.o. female presenting today for evaluation of vaginal discharge.  Patient reports over the past 5 days she has had vaginal discharge with associated odor.  Denies any associated itching or irritation.  Denies urinary symptoms.  Would like to be screened for STDs.  Reports history of BV.  Patient does not currently breast-feeding.  HPI  Past Medical History:  Diagnosis Date  . Medical history non-contributory     Patient Active Problem List   Diagnosis Date Noted  . Postpartum hemorrhage 11/20/2018  . NSVD (normal spontaneous vaginal delivery) 11/19/2018    Past Surgical History:  Procedure Laterality Date  . NO PAST SURGERIES      OB History    Gravida  1   Para  1   Term  1   Preterm      AB      Living  1     SAB      TAB      Ectopic      Multiple  0   Live Births  1            Home Medications    Prior to Admission medications   Medication Sig Start Date End Date Taking? Authorizing Provider  acetaminophen (TYLENOL) 325 MG tablet Take 2 tablets (650 mg total) by mouth every 4 (four) hours as needed (for pain scale < 4). 11/22/18   [place] Bing, MD  ferrous gluconate (FERGON) 324 MG tablet Take 1 tablet (324 mg total) by mouth daily with breakfast. 11/22/18 12/22/18  [place] Bing, MD  metroNIDAZOLE (FLAGYL) 500 MG tablet Take 1 tablet (500 mg total) by mouth 2 (two) times daily for 7 days. 06/05/20 06/12/20  Tennis Mckinnon, Junius Creamer, PA-C    Family History Family History  Problem Relation Age of Onset  . Hypertension Mother   . Diabetes Maternal Grandmother   . Diabetes Paternal Grandmother     Social History Social History   Tobacco Use  . Smoking status: Never Smoker  . Smokeless tobacco: Never Used  Vaping Use  . Vaping  Use: Never used  Substance Use Topics  . Alcohol use: Never  . Drug use: Not Currently    Types: Marijuana    Comment: Not during pregnancy     Allergies   Patient has no known allergies.   Review of Systems Review of Systems  Constitutional: Negative for fever.  Respiratory: Negative for shortness of breath.   Cardiovascular: Negative for chest pain.  Gastrointestinal: Negative for abdominal pain, diarrhea, nausea and vomiting.  Genitourinary: Positive for vaginal discharge. Negative for dysuria, flank pain, genital sores, hematuria, menstrual problem, vaginal bleeding and vaginal pain.  Musculoskeletal: Negative for back pain.  Skin: Negative for rash.  Neurological: Negative for dizziness, light-headedness and headaches.     Physical Exam Triage Vital Signs ED Triage Vitals  Enc Vitals Group     BP 06/05/20 1737 116/65     Pulse Rate 06/05/20 1737 65     Resp 06/05/20 1737 16     Temp 06/05/20 1737 98.8 F (37.1 C)     Temp Source 06/05/20 1737 Oral     SpO2 06/05/20 1737 100 %     Weight --  Height --      Head Circumference --      Peak Flow --      Pain Score 06/05/20 1730 0     Pain Loc --      Pain Edu? --      Excl. in GC? --    No data found.  Updated Vital Signs BP 116/65 (BP Location: Right Leg)   Pulse 65   Temp 98.8 F (37.1 C) (Oral)   Resp 16   LMP 05/21/2020   SpO2 100%   Visual Acuity Right Eye Distance:   Left Eye Distance:   Bilateral Distance:    Right Eye Near:   Left Eye Near:    Bilateral Near:     Physical Exam Vitals and nursing note reviewed.  Constitutional:      Appearance: She is well-developed.     Comments: No acute distress  HENT:     Head: Normocephalic and atraumatic.     Nose: Nose normal.  Eyes:     Conjunctiva/sclera: Conjunctivae normal.  Cardiovascular:     Rate and Rhythm: Normal rate.  Pulmonary:     Effort: Pulmonary effort is normal. No respiratory distress.  Abdominal:     General: There  is no distension.  Musculoskeletal:        General: Normal range of motion.     Cervical back: Neck supple.  Skin:    General: Skin is warm and dry.  Neurological:     Mental Status: She is alert and oriented to person, place, and time.      UC Treatments / Results  Labs (all labs ordered are listed, but only abnormal results are displayed) Labs Reviewed  POC URINE PREG, ED  POCT URINALYSIS DIP (DEVICE)  CERVICOVAGINAL ANCILLARY ONLY    EKG   Radiology No results found.  Procedures Procedures (including critical care time)  Medications Ordered in UC Medications - No data to display  Initial Impression / Assessment and Plan / UC Course  I have reviewed the triage vital signs and the nursing notes.  Pertinent labs & imaging results that were available during my care of the patient were reviewed by me and considered in my medical decision making (see chart for details).    Pregnancy test negative, UA unremarkable, vaginal swab pending for STD screening as well as yeast/BV.  Empirically treating for BV based off symptoms and history of prior BV with metronidazole twice daily x1 week.  Will alter therapy as needed based off results.  Discussed strict return precautions. Patient verbalized understanding and is agreeable with plan.  Final Clinical Impressions(s) / UC Diagnoses   Final diagnoses:  Vaginal discharge     Discharge Instructions     Begin metronidazole twice daily x 1 week for BV  We are testing you for Gonorrhea, Chlamydia, Trichomonas, Yeast and Bacterial Vaginosis. We will call you if anything is positive and let you know if you require any further treatment. Please inform partners of any positive results.   Please return if symptoms not improving with treatment, development of fever, nausea, vomiting, abdominal pain.    ED Prescriptions    Medication Sig Dispense Auth. Provider   metroNIDAZOLE (FLAGYL) 500 MG tablet Take 1 tablet (500 mg total) by  mouth 2 (two) times daily for 7 days. 14 tablet Kanda Deluna, [place] C, PA-C     PDMP not reviewed this encounter.   Lew Dawes, [place] 06/06/20 [phone]

## 2020-06-07 LAB — CERVICOVAGINAL ANCILLARY ONLY
Bacterial Vaginitis (gardnerella): NEGATIVE
Candida Glabrata: NEGATIVE
Candida Vaginitis: POSITIVE — AB
Chlamydia: NEGATIVE
Comment: NEGATIVE
Comment: NEGATIVE
Comment: NEGATIVE
Comment: NEGATIVE
Comment: NEGATIVE
Comment: NORMAL
Neisseria Gonorrhea: NEGATIVE
Trichomonas: NEGATIVE

## 2020-06-08 ENCOUNTER — Telehealth (HOSPITAL_COMMUNITY): Payer: Self-pay | Admitting: Emergency Medicine

## 2020-06-08 MED ORDER — FLUCONAZOLE 150 MG PO TABS
150.0000 mg | ORAL_TABLET | Freq: Once | ORAL | 0 refills | Status: AC
Start: 2020-06-08 — End: 2020-06-08

## 2020-06-08 NOTE — Telephone Encounter (Signed)
Attempted to call patient to make her aware to discontinue Flagyl and start Diflucan.  No answer, LVM.  Will send Diflucan to pharmacy on file.

## 2020-07-30 ENCOUNTER — Other Ambulatory Visit: Payer: Self-pay

## 2020-07-30 ENCOUNTER — Encounter (HOSPITAL_COMMUNITY): Payer: Self-pay

## 2020-07-30 ENCOUNTER — Inpatient Hospital Stay (HOSPITAL_COMMUNITY): Payer: No Typology Code available for payment source

## 2020-07-30 ENCOUNTER — Inpatient Hospital Stay (HOSPITAL_COMMUNITY)
Admission: AD | Admit: 2020-07-30 | Discharge: 2020-07-30 | Disposition: A | Discharge status: Home / Self Care | Payer: No Typology Code available for payment source | Attending: Obstetrics and Gynecology | Admitting: Obstetrics and Gynecology

## 2020-07-30 DIAGNOSIS — Z79899 Other long term (current) drug therapy: Secondary | ICD-10-CM | POA: Diagnosis not present

## 2020-07-30 DIAGNOSIS — N939 Abnormal uterine and vaginal bleeding, unspecified: Secondary | ICD-10-CM | POA: Insufficient documentation

## 2020-07-30 DIAGNOSIS — O034 Incomplete spontaneous abortion without complication: Secondary | ICD-10-CM | POA: Diagnosis not present

## 2020-07-30 LAB — CBC
HCT: 34.9 % — ABNORMAL LOW (ref 36.0–46.0)
Hemoglobin: 11.6 g/dL — ABNORMAL LOW (ref 12.0–15.0)
MCH: 29.9 pg (ref 26.0–34.0)
MCHC: 33.2 g/dL (ref 30.0–36.0)
MCV: 89.9 fL (ref 80.0–100.0)
Platelets: 219 10*3/uL (ref 150–400)
RBC: 3.88 MIL/uL (ref 3.87–5.11)
RDW: 12.2 % (ref 11.5–15.5)
WBC: 4.8 10*3/uL (ref 4.0–10.5)
nRBC: 0 % (ref 0.0–0.2)

## 2020-07-30 MED ORDER — DICYCLOMINE HCL 10 MG/5ML PO SOLN: 10.0000 mg | Freq: Once | ORAL | Status: DC

## 2020-07-30 MED ORDER — MISOPROSTOL 200 MCG PO TABS
200.0000 ug | ORAL_TABLET | Freq: Three times a day (TID) | ORAL | 0 refills | Status: DC
Start: 2020-07-30 — End: 2023-05-10

## 2020-07-30 MED ORDER — LIDOCAINE VISCOUS HCL 2 % MT SOLN: 15.0000 mL | Freq: Once | OROMUCOSAL | Status: DC

## 2020-07-30 MED ORDER — MISOPROSTOL 200 MCG PO TABS
800.0000 ug | ORAL_TABLET | Freq: Once | ORAL | Status: AC
  Administered 2020-07-30: 800 ug via ORAL
  Filled 2020-07-30: qty 4

## 2020-07-30 MED ORDER — ALUM & MAG HYDROXIDE-SIMETH 200-200-20 MG/5ML PO SUSP: 30.0000 mL | Freq: Once | ORAL | Status: DC

## 2020-07-30 NOTE — MAU Provider Note (Signed)
Chief Complaint: Vaginal Bleeding   First Provider Initiated Contact with Patient 07/30/20 1932     SUBJECTIVE HPI: Patty Wells is a 24 y.o. G1P1001 s/p medical abortion on 07/03/20 who presents to [facility] Admissions reporting heavy vaginal bleeding without cramping. She has been bleeding since the procedure, it got worse on Wednesday (07/28/20) so [facility] went back to [facility] who confirmed retained POCs and scheduled an aspiration for tomorrow. Tonight, she was at work and began bleeding heavily so came to MAU. She denies N/V/D or recent illness.   Past Medical History:  Diagnosis Date   Medical history non-contributory    OB History  Gravida Para Term Preterm AB Living  1 1 1     1   SAB TAB Ectopic Multiple Live Births        0 1    # Outcome Date GA Lbr Len/2nd Weight Sex Delivery Anes PTL Lv  1 Term 11/20/18 [redacted]w[redacted]d 17:56 / 01:07 7 lb 0.7 oz (3.195 kg) M Vag-Spont EPI  LIV     Birth Comments: WNL   Past Surgical History:  Procedure Laterality Date   NO PAST SURGERIES     Social History   Socioeconomic History   Marital status: Significant Other    Spouse name: Not on file   Number of children: Not on file   Years of education: Not on file   Highest education level: Not on file  Occupational History   Not on file  Tobacco Use   Smoking status: Never Smoker   Smokeless tobacco: Never Used  Vaping Use   Vaping Use: Never used  Substance and Sexual Activity   Alcohol use: Never   Drug use: Not Currently    Types: Marijuana    Comment: Not during pregnancy   Sexual activity: Yes    Birth control/protection: None  Other Topics Concern   Not on file  Social History Narrative   Not on file   Social Determinants of Health   Financial Resource Strain:    Difficulty of Paying Living Expenses: Not on file  Food Insecurity:    Worried About [redacted]w[redacted]d in the Last Year: Not on file   Programme researcher, broadcasting/film/video of Food in the Last Year: Not on file   Transportation Needs:    Lack of Transportation (Medical): Not on file   Lack of Transportation (Non-Medical): Not on file  Physical Activity:    Days of Exercise per Week: Not on file   Minutes of Exercise per Session: Not on file  Stress:    Feeling of Stress : Not on file  Social Connections:    Frequency of Communication with Friends and Family: Not on file   Frequency of Social Gatherings with Friends and Family: Not on file   Attends Religious Services: Not on file   Active Member of Clubs or Organizations: Not on file   Attends [employer] Meetings: Not on file   Marital Status: Not on file  Intimate Partner Violence:    Fear of Current or Ex-Partner: Not on file   Emotionally Abused: Not on file   Physically Abused: Not on file   Sexually Abused: Not on file   Family History  Problem Relation Age of Onset   Hypertension Mother    Diabetes Maternal Grandmother    Diabetes Paternal Grandmother    No current facility-administered medications on file prior to encounter.   Current Outpatient Medications on File Prior to Encounter  Medication Sig Dispense  Refill   acetaminophen (TYLENOL) 325 MG tablet Take 2 tablets (650 mg total) by mouth every 4 (four) hours as needed (for pain scale < 4).     ferrous gluconate (FERGON) 324 MG tablet Take 1 tablet (324 mg total) by mouth daily with breakfast. 30 tablet 0   No Known Allergies  I have reviewed patient's Past Medical Hx, Surgical Hx, Family Hx, Social Hx, medications and allergies.   Review of Systems - A comprehensive review of systems was negative except things noted in the HPI.  OBJECTIVE Patient Vitals for the past 24 hrs:  BP Temp Temp src Pulse Resp SpO2  07/30/20 1850 112/67 -- -- (!) 103 -- --  07/30/20 1846 (!) 130/104 97.8 F (36.6 C) Oral (!) 113 20 100 %   Constitutional: Well-developed, well-nourished female in no acute distress.  Cardiovascular: normal rate & rhythm, no  murmur Respiratory: normal rate and effort. Lung sounds clear throughout GI: Abd soft, non-tender, Pos BS x 4. No guarding or rebound tenderness MS: Extremities nontender, no edema, normal ROM Neurologic: Alert and oriented x 4.  SPECULUM EXAM: NEFG, physiologic discharge, moderate dark red blood noted, two large clots pulled from introitus and cervix.  LAB RESULTS Results for orders placed or performed during the hospital encounter of 07/30/20 (from the past 24 hour(s))  CBC     Status: Abnormal   Collection Time: 07/30/20  8:17 PM  Result Value Ref Range   WBC 4.8 4.0 - 10.5 K/uL   RBC 3.88 3.87 - 5.11 MIL/uL   Hemoglobin 11.6 (L) 12.0 - 15.0 g/dL   HCT 65.7 (L) 36 - 46 %   MCV 89.9 80.0 - 100.0 fL   MCH 29.9 26.0 - 34.0 pg   MCHC 33.2 30.0 - 36.0 g/dL   RDW 84.6 96.2 - 95.2 %   Platelets 219 150 - 400 K/uL   nRBC 0.0 0.0 - 0.2 %    IMAGING US OB LESS THAN 14 WEEKS WITH OB TRANSVAGINAL  Result Date: 07/30/2020 CLINICAL DATA:  Recent abortion, vaginal breathing EXAM: OBSTETRIC <14 WK [place] AND TRANSVAGINAL OB US TECHNIQUE: Both transabdominal and transvaginal ultrasound examinations were performed for complete evaluation of the gestation as well as the maternal uterus, adnexal regions, and pelvic cul-de-sac. Transvaginal technique was performed to assess early pregnancy. COMPARISON:  None. FINDINGS: Intrauterine gestational sac: None Subchorionic hemorrhage:  None visualized. Maternal uterus/adnexae: There is a thickened endometrium which is highly vascular seen throughout measuring up to 2.1 cm. There is a heterogeneously thickened area with cystic lucency seen within the cervix. The ovaries are normal in appearance. IMPRESSION: Findings which could be suggestive of retained products of conception. Electronically Signed   By: Jonna Clark M.D.   On: 07/30/2020 21:18    MAU COURSE  Orders Placed This Encounter  Procedures   US OB LESS THAN 14 WEEKS WITH OB TRANSVAGINAL   CBC    Orthostatic vital signs   Discharge patient   Meds ordered this encounter  Medications   DISCONTD: alum & mag hydroxide-simeth (MAALOX/MYLANTA) 200-200-20 MG/5ML suspension 30 mL   DISCONTD: lidocaine (XYLOCAINE) 2 % viscous mouth solution 15 mL   DISCONTD: dicyclomine (BENTYL) 10 MG/5ML solution 10 mg   misoprostol (CYTOTEC) tablet 800 mcg   misoprostol (CYTOTEC) 200 MCG tablet    Sig: Take 1 tablet (200 mcg total) by mouth 3 (three) times daily.    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 Educated on risks for PPH vs expected bleeding with an abortion or miscarriage.  Hgb and orthostatics stable, U/S showed likely POCs and thickened endometrium.  Consulted with Dr. Despina Hidden who recommended Cytotec x1 + 3 days of Cytotec at home. Discussed with patient and gave reassurance about her risks of hemorrhage/what we expect to happen when passing POCs. Pt verbalized understanding and agreed to plan of care.   ASSESSMENT 1. Retained products of conception following abortion   2. Vaginal bleeding     PLAN Discharge home in stable condition with bleeding precautions. Discussed options for follow up, pt requests to begin care at [facility]   Follow-up Information    [facility]. Schedule an appointment as soon as possible for a visit in 1 month(s).   Specialty: Obstetrics and Gynecology Why: Call to make an appointment to establish gyn care. Request appointment with Edd Arbour, CNM Contact information: [address] [place] [facility] [place] [address] [phone]             Allergies as of 07/30/2020   No Known Allergies     Medication List    TAKE these medications   acetaminophen 325 MG tablet Commonly known as: Tylenol Take 2 tablets (650 mg total) by mouth every 4 (four)  hours as needed (for pain scale < 4).   ferrous gluconate 324 MG tablet Commonly known as: FERGON Take 1 tablet (324 mg total) by mouth daily with breakfast.   misoprostol 200 MCG tablet Commonly known as: CYTOTEC Take 1 tablet (200 mcg total) by mouth 3 (three) times daily.      Edd Arbour, CNM, MSN, [facility] 07/30/20 11:05 PM

## 2020-07-30 NOTE — Discharge Instructions (Signed)
Prostaglandin-Induced Abortion, Care After This sheet gives you information about how to care for yourself after your procedure. Your health care provider may also give you more specific instructions. If you have problems or questions, contact your health care provider. What can I expect after the procedure? After the procedure, it is common to have:  Bleeding that lasts for a few hours or a few days. It may feel like you are having a heavy menstrual period.  A headache.  Diarrhea.  Nausea and vomiting.  Chills.  Dizziness. Your next period will most likely start 4-6 weeks after the procedure, unless you start taking birth control pills. Follow these instructions at home: Medicines   Take over-the-counter and prescription medicines only as told by your health care provider.  Only take the medicines your health care provider recommends. Do not take aspirin. It can cause bleeding. Activity  Do not have sex for 2-3 weeks or until your health care provider approves.  Rest and avoid activity that requires a lot of energy for 2-3 weeks.  Do not drive or use heavy machinery while taking prescription pain medicine. General instructions  There will be bleeding after the procedure. It is recommended that you: ? Write down how many menstrual pads you use each day and how soaked they are. This could be useful information for your health care provider. ? Check for any large blood clots or tissue when you change your menstrual pad. If you pass tissue, save the tissue to show to your health care provider.  Do not douche or use tampons until your health care provider approves.  Ask your health care provider when you can start using hormonal birth control (contraception).  Keep all follow-up visits as told by your health care provider. This is important. Contact a health care provider if:  You have chills or a fever.  You have pain that is not relieved by prescription pain  medicine.  You have a bad-smelling vaginal discharge.  You have pain or bleeding that gets worse instead of better.  You have any of the following for more than 24 hours: ? Nausea. ? Vomiting. ? Diarrhea. Get help right away if:  You have severe cramps in your stomach, back, or abdomen.  You pass large blood clots or tissue out of your vagina. Save any tissue for your health care provider to inspect.  You need to change your pad more than once in an hour.  You become light-headed, weak, or faint. Summary  After the procedure it is common to have bleeding, headache, diarrhea, nausea and vomiting, chills, and dizziness.  Take over-the-counter and prescription medicines only as told by your health care provider.  Do not have sex for 2-3 weeks or until your health care provider approves.  When you are bleeding after the procedure, write down how many menstrual pads you use each day and how soaked they are.  Keep all follow-up visits as told by your health care provider. This is important. This information is not intended to replace advice given to you by your health care provider. Make sure you discuss any questions you have with your health care provider. Document Revised: 10/12/2017 Document Reviewed: 01/17/2017 Elsevier Patient Education  2020 [employer].   Contraception Choices Contraception, also called birth control, means things to use or ways to try not to get pregnant. Hormonal birth control This kind of birth control uses hormones. Here are some types of hormonal birth control:  A tube that is put under  skin of the arm (implant). The tube can stay in for as long as 3 years.  Shots to get every 3 months (injections).  Pills to take every day (birth control pills).  A patch to change 1 time each week for 3 weeks (birth control patch). After that, the patch is taken off for 1 week.  A ring to put in the vagina. The ring is left in for 3 weeks. Then it is taken  out of the vagina for 1 week. Then a new ring is put in.  Pills to take after unprotected sex (emergency birth control pills). Barrier birth control Here are some types of barrier birth control:  A thin covering that is put on the penis before sex (female condom). The covering is thrown away after sex.  A soft, loose covering that is put in the vagina before sex (female condom). The covering is thrown away after sex.  A rubber bowl that sits over the cervix (diaphragm). The bowl must be made for you. The bowl is put into the vagina before sex. The bowl is left in for 6-8 hours after sex. It is taken out within 24 hours.  A small, soft cup that fits over the cervix (cervical cap). The cup must be made for you. The cup can be left in for 6-8 hours after sex. It is taken out within 48 hours.  A sponge that is put into the vagina before sex. It must be left in for at least 6 hours after sex. It must be taken out within 30 hours. Then it is thrown away.  A chemical that kills or stops sperm from getting into the uterus (spermicide). It may be a pill, cream, jelly, or foam to put in the vagina. The chemical should be used at least 10-15 minutes before sex. IUD (intrauterine) birth control An IUD is a small, T-shaped piece of plastic. It is put inside the uterus. There are two kinds:  Hormone IUD. This kind can stay in for 3-5 years.  Copper IUD. This kind can stay in for 10 years. Permanent birth control Here are some types of permanent birth control:  Surgery to block the fallopian tubes.  Having an insert put into each fallopian tube.  Surgery to tie off the tubes that carry sperm (vasectomy). Natural planning birth control Here are some types of natural planning birth control:  Not having sex on the days the woman could get pregnant.  Using a calendar: ? To keep track of the length of each period. ? To find out what days pregnancy can happen. ? To plan to not have sex on days when  pregnancy can happen.  Watching for symptoms of ovulation and not having sex during ovulation. One way the woman can check for ovulation is to check her temperature.  Waiting to have sex until after ovulation. Summary  Contraception, also called birth control, means things to use or ways to try not to get pregnant.  Hormonal methods of birth control include implants, injections, pills, patches, vaginal rings, and emergency birth control pills.  Barrier methods of birth control can include female condoms, female condoms, diaphragms, cervical caps, sponges, and spermicides.  There are two types of IUD (intrauterine device) birth control. An IUD can be put in a woman's uterus to prevent pregnancy for 3-5 years.  Permanent sterilization can be done through a procedure for males, females, or both.  Natural planning methods involve not having sex on the days when the woman  could get pregnant. This information is not intended to replace advice given to you by your health care provider. Make sure you discuss any questions you have with your health care provider. Document Revised: 02/19/2019 Document Reviewed: 11/09/2016 Elsevier Patient Education  2020 [employer].

## 2020-07-30 NOTE — MAU Note (Signed)
Patty Wells is a 24 y.o. here in MAU reporting: [facility]. Went back yesterday and they said she had a retained sac and she has an aspiration scheduled for tomorrow. Bleeding started picking up Wednesday night. 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 No pain.  LMP: 05/16/20  Onset of complaint: ongoing  Pain score: 0/10  Vitals:   07/30/20 1846  BP: (!) 130/104  Pulse: (!) 113  Resp: 20  Temp: 97.8 F (36.6 C)  SpO2: 100%     Lab orders placed from triage: upt

## 2020-08-03 ENCOUNTER — Telehealth: Payer: Self-pay | Admitting: Certified Nurse Midwife

## 2020-08-03 NOTE — Telephone Encounter (Signed)
Called patient to check in on how she's been since starting her cytotec regimen. She had some heavier bleeding the first day, is continuing to bleed but it is significantly less and she's able to work/workout without issue. She is also feeling better emotionally but accepted offer of post-abortion doula care and agreed to let me share her phone number. Patty Wells, one of the [facility] doulas, will be in contact.

## 2020-08-19 ENCOUNTER — Ambulatory Visit: Payer: No Typology Code available for payment source | Attending: Internal Medicine

## 2020-08-19 DIAGNOSIS — Z23 Encounter for immunization: Secondary | ICD-10-CM

## 2020-08-19 NOTE — Progress Notes (Signed)
   Covid-19 Vaccination Clinic  Name:  Patty Wells    MRN: [id number] DOB: 11-03-1996  08/19/2020  [name] was observed post Covid-19 immunization for 15 minutes without incident. She was provided with Vaccine Information Sheet and instruction to access the V-Safe system.   [name] was instructed to call 911 with any severe reactions post vaccine: Marland Kitchen Difficulty breathing  . Swelling of face and throat  . A fast heartbeat  . A bad rash all over body  . Dizziness and weakness   Immunizations Administered    Name Date Dose VIS Date Route   Pfizer COVID-19 Vaccine 08/19/2020 11:32 AM 0.3 mL 01/07/2019 Intramuscular   Manufacturer: [employer], [employer]   Lot: [id number]   NDC: [address]-5

## 2020-09-08 ENCOUNTER — Encounter: Payer: No Typology Code available for payment source | Admitting: Certified Nurse Midwife

## 2020-09-15 ENCOUNTER — Ambulatory Visit: Payer: Medicaid Other | Admitting: Internal Medicine

## 2020-12-23 LAB — OB RESULTS CONSOLE VARICELLA ZOSTER ANTIBODY, IGG: Varicella: IMMUNE

## 2022-03-07 ENCOUNTER — Other Ambulatory Visit: Payer: Self-pay

## 2022-03-07 ENCOUNTER — Ambulatory Visit
Admission: EM | Admit: 2022-03-07 | Discharge: 2022-03-07 | Disposition: A | Discharge status: Home / Self Care | Payer: Medicaid Other | Attending: Urgent Care | Admitting: Urgent Care

## 2022-03-07 ENCOUNTER — Encounter: Payer: Self-pay | Admitting: Emergency Medicine

## 2022-03-07 DIAGNOSIS — M542 Cervicalgia: Secondary | ICD-10-CM | POA: Diagnosis not present

## 2022-03-07 DIAGNOSIS — M549 Dorsalgia, unspecified: Secondary | ICD-10-CM

## 2022-03-07 DIAGNOSIS — M545 Low back pain, unspecified: Secondary | ICD-10-CM | POA: Diagnosis not present

## 2022-03-07 MED ORDER — NAPROXEN 500 MG PO TABS: 500.0000 mg | ORAL_TABLET | Freq: Two times a day (BID) | ORAL | 0 refills | Status: DC

## 2022-03-07 MED ORDER — TIZANIDINE HCL 4 MG PO TABS: 4.0000 mg | ORAL_TABLET | Freq: Every day | ORAL | 0 refills | Status: DC
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
## 2022-05-10 IMAGING — US US OB < 14 WEEKS - US OB TV
1 series · 15 of 28 positions shown · non-contrast
Comparison: None.

CLINICAL DATA: Recent abortion, vaginal breathing

EXAM:
OBSTETRIC <14 WK US AND TRANSVAGINAL OB US
TECHNIQUE: Both transabdominal and transvaginal ultrasound examinations were
performed for complete evaluation of the gestation as well as the
maternal uterus, adnexal regions, and pelvic cul-de-sac.
Transvaginal technique was performed to assess early pregnancy.

[Series 1: us ob < 14 weeks - us ob tv · 15 of 47 slices shown]
[im 1/47]
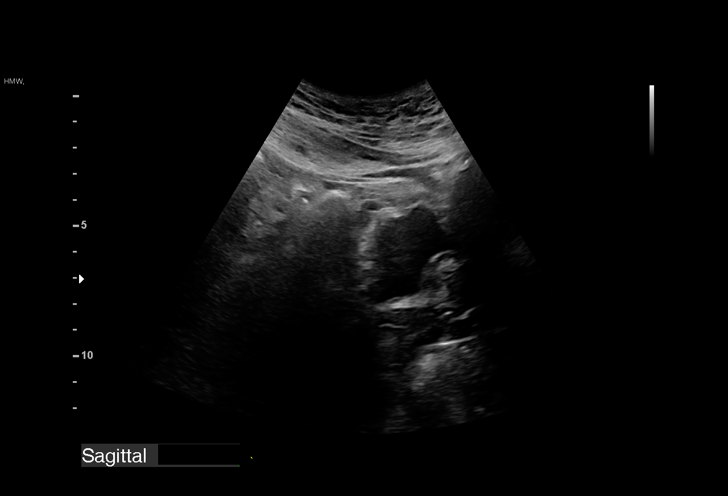
[im 4/47]
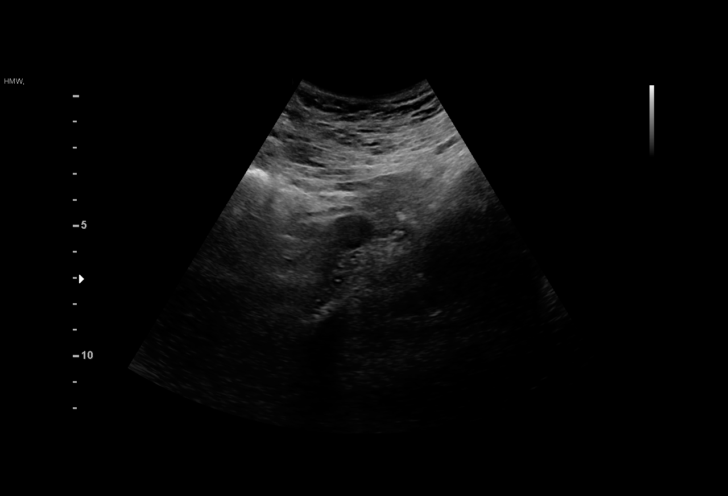
[im 7/47]
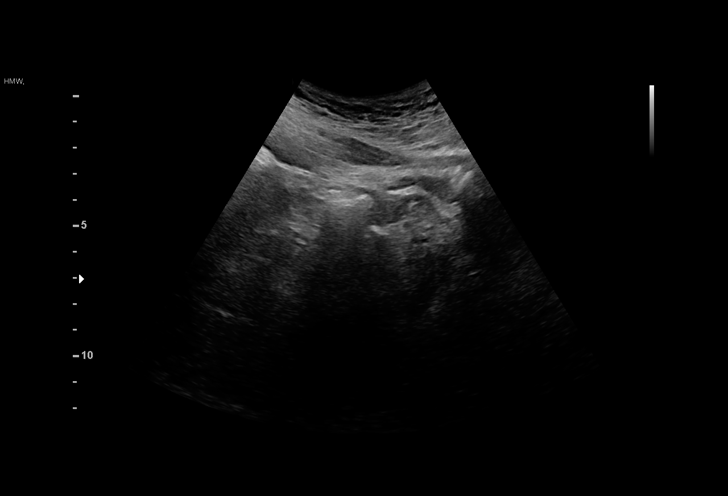
[im 11/47]
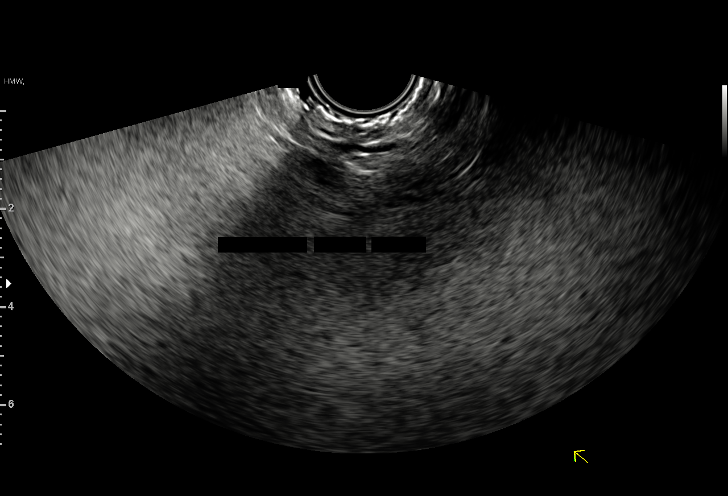
[im 14/47]
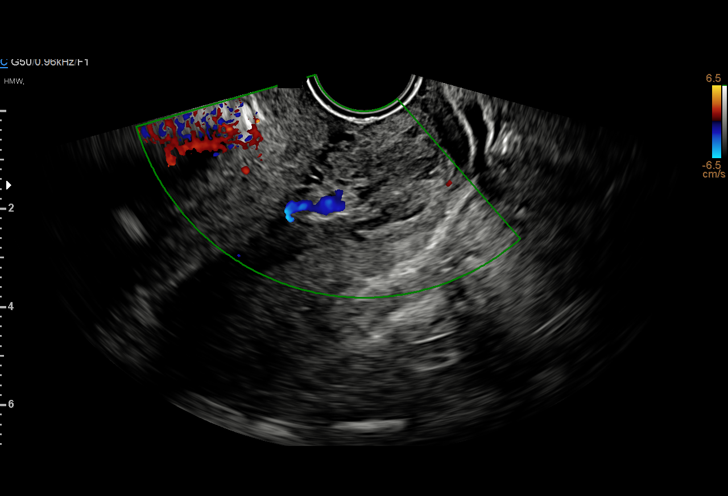
[im 18/47]
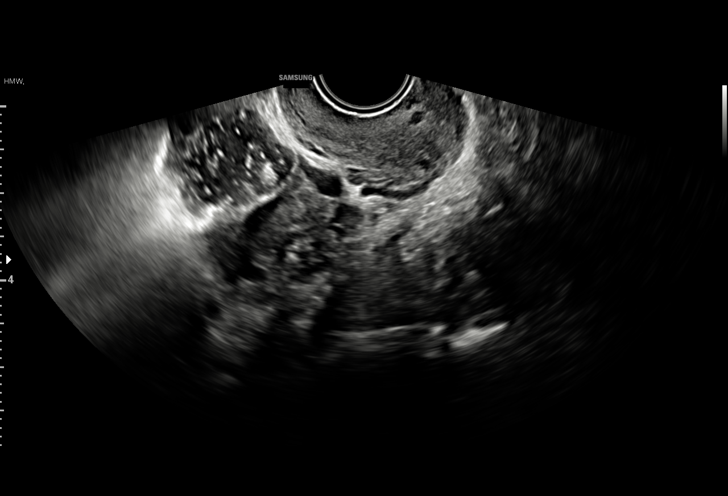
[im 21/47]
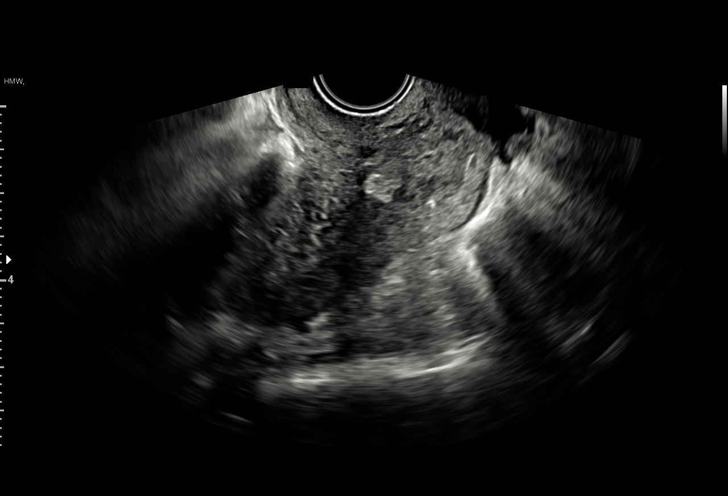
[im 24/47]
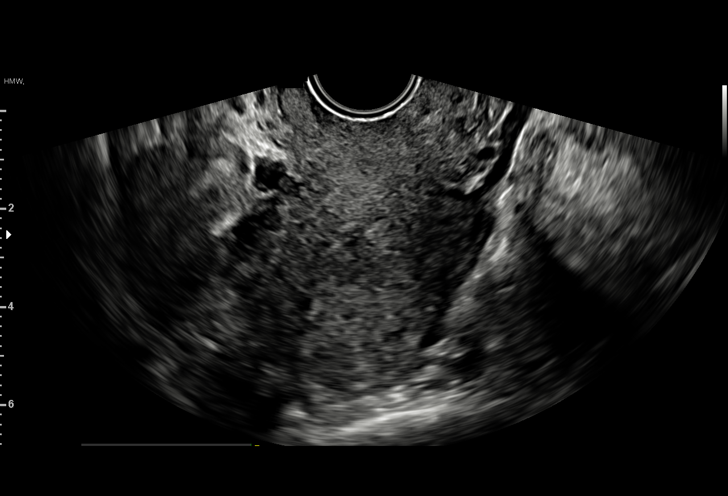
[im 26/47]
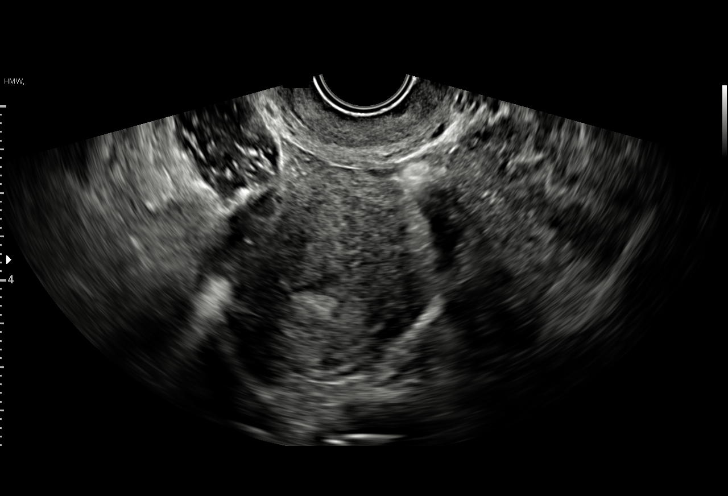
[im 29/47]
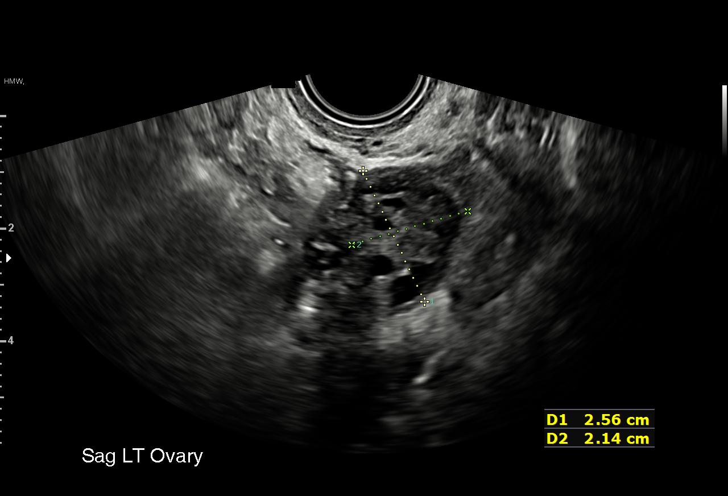
[im 33/47]
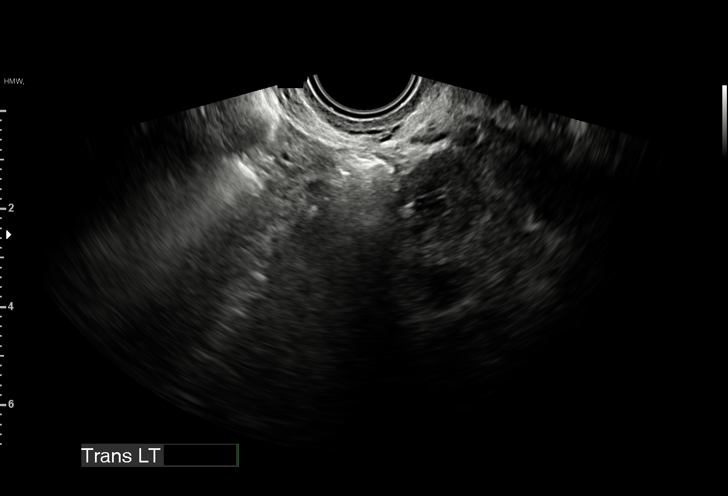
[im 36/47]
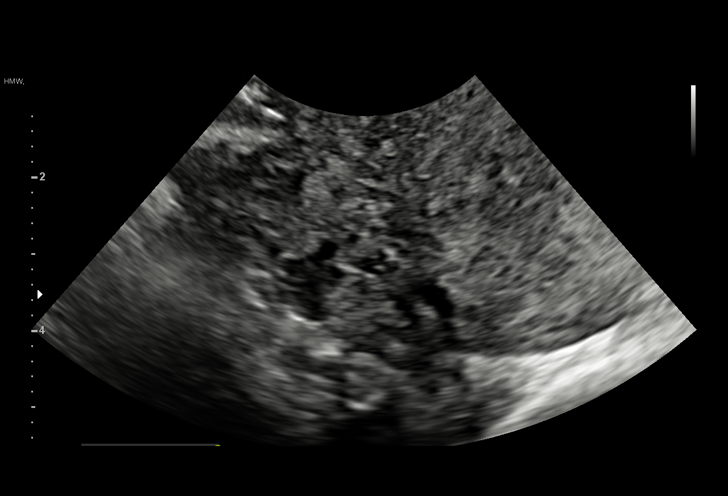
[im 40/47]
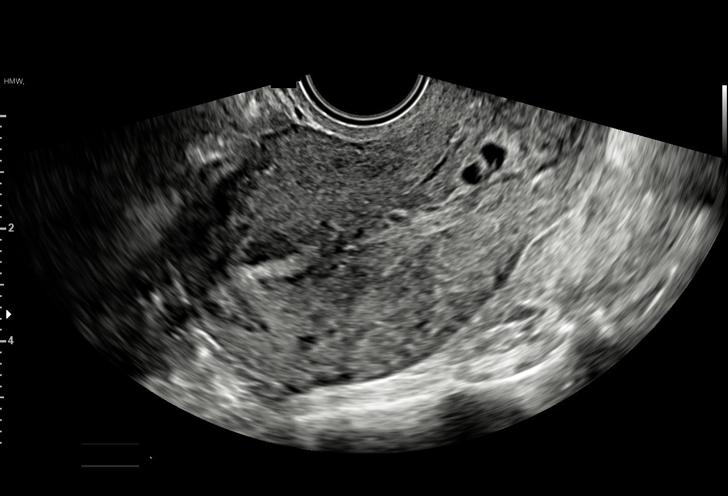
[im 43/47]
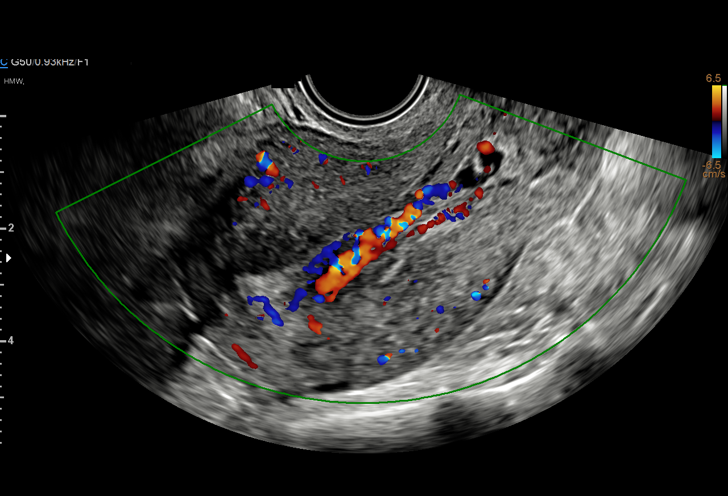
[im 47/47]
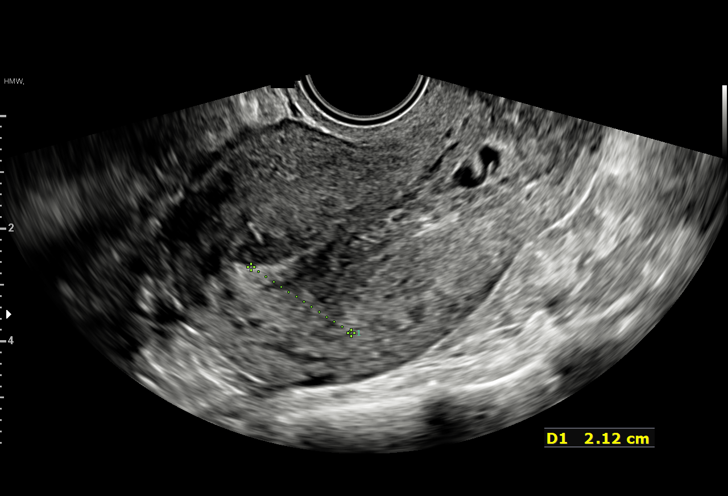

[15 of 28 positions shown; findings below may reference images not displayed]

FINDINGS: Intrauterine gestational sac: None

Subchorionic hemorrhage:  None visualized.

Maternal uterus/adnexae: There is a thickened endometrium which is
highly vascular seen throughout measuring up to 2.1 cm. There is a
heterogeneously thickened area with cystic lucency seen within the
cervix. The ovaries are normal in appearance.
IMPRESSION: Findings which could be suggestive of retained products of
conception.

## 2022-11-09 LAB — OB RESULTS CONSOLE RUBELLA ANTIBODY, IGM
Rubella: IMMUNE
Rubella: IMMUNE

## 2022-11-09 LAB — OB RESULTS CONSOLE RPR: RPR: NONREACTIVE

## 2022-11-09 LAB — OB RESULTS CONSOLE HEPATITIS B SURFACE ANTIGEN: Hepatitis B Surface Ag: NEGATIVE

## 2022-11-09 LAB — OB RESULTS CONSOLE HIV ANTIBODY (ROUTINE TESTING): HIV: NONREACTIVE

## 2022-11-13 NOTE — L&D Delivery Note (Signed)
   Delivery Note:   G3P1011 at [redacted]w[redacted]d  Admitting diagnosis: Pregnancy [Z34.90] Risks: GBS+, prolonged ROM 25 hrs Onset of labor: 05/07/2003 at 0500 IOL/Augmentation: AROM, Pitocin, and Cytotec ROM: 05/07/2003 at 1814, clear fluid  Complete dilation at 05/08/2023  1853 Onset of pushing at 1853 FHR second stage Cat I  Analgesia/Anesthesia intrapartum:Epidural  Pushing in lithotomy position with CNM and L&D staff support. Partner, Alex, present for birth and supportive.  Delivery of a Live born female  Birth Weight:  pending APGAR: 8, 9   Newborn Delivery   Birth date/time: 05/08/2023 19:10:40 Delivery type: Vaginal, Spontaneous     in cephalic presentation, position OA to LOA.  APGAR:1 min-8 , 5 min-8   Nuchal Cord: No  Cord double clamped after cessation of pulsation, cut by Trinna Post.  Collection of cord blood for typing completed. Arterial cord blood sample-No    Placenta delivered-Spontaneous  with 3 vessels . Uterotonics: Pitocin Placenta to L&D Uterine tone firm  Bleeding scant  None  laceration identified.  Episiotomy:None  Local analgesia: N/A  Repair: N/A Est. Blood Loss (mL):112.00   Complications: ROM>24 hours  Mom to postpartum. Baby Averie to Couplet care / Skin to Skin.  Delivery Report:   Review the Delivery Report for details.    June Leap, CNM, MSN 05/08/2023, 7:25 PM

## 2022-11-15 ENCOUNTER — Other Ambulatory Visit: Payer: Self-pay

## 2022-11-18 ENCOUNTER — Other Ambulatory Visit: Payer: Self-pay

## 2022-11-20 LAB — OB RESULTS CONSOLE GC/CHLAMYDIA
Chlamydia: NEGATIVE
Neisseria Gonorrhea: NEGATIVE

## 2022-12-21 ENCOUNTER — Other Ambulatory Visit: Payer: Self-pay

## 2022-12-21 DIAGNOSIS — Z363 Encounter for antenatal screening for malformations: Secondary | ICD-10-CM

## 2023-01-01 ENCOUNTER — Encounter: Payer: Self-pay | Admitting: *Deleted

## 2023-01-03 ENCOUNTER — Ambulatory Visit: Payer: 59 | Admitting: *Deleted

## 2023-01-03 ENCOUNTER — Encounter: Payer: Self-pay | Admitting: *Deleted

## 2023-01-03 ENCOUNTER — Ambulatory Visit: Payer: 59 | Attending: Obstetrics and Gynecology

## 2023-01-03 VITALS — BP 106/65 | HR 83

## 2023-01-03 DIAGNOSIS — O09292 Supervision of pregnancy with other poor reproductive or obstetric history, second trimester: Secondary | ICD-10-CM | POA: Diagnosis not present

## 2023-01-03 DIAGNOSIS — O99210 Obesity complicating pregnancy, unspecified trimester: Secondary | ICD-10-CM | POA: Diagnosis not present

## 2023-01-03 DIAGNOSIS — O4402 Placenta previa specified as without hemorrhage, second trimester: Secondary | ICD-10-CM | POA: Insufficient documentation

## 2023-01-03 DIAGNOSIS — Z363 Encounter for antenatal screening for malformations: Secondary | ICD-10-CM | POA: Diagnosis not present

## 2023-01-03 DIAGNOSIS — E669 Obesity, unspecified: Secondary | ICD-10-CM

## 2023-01-03 DIAGNOSIS — O09299 Supervision of pregnancy with other poor reproductive or obstetric history, unspecified trimester: Secondary | ICD-10-CM

## 2023-01-03 DIAGNOSIS — O2692 Pregnancy related conditions, unspecified, second trimester: Secondary | ICD-10-CM | POA: Insufficient documentation

## 2023-01-03 DIAGNOSIS — O99212 Obesity complicating pregnancy, second trimester: Secondary | ICD-10-CM | POA: Insufficient documentation

## 2023-01-03 DIAGNOSIS — Z3A22 22 weeks gestation of pregnancy: Secondary | ICD-10-CM

## 2023-01-04 ENCOUNTER — Other Ambulatory Visit: Payer: Self-pay | Admitting: *Deleted

## 2023-01-04 DIAGNOSIS — O99212 Obesity complicating pregnancy, second trimester: Secondary | ICD-10-CM

## 2023-01-04 DIAGNOSIS — O09299 Supervision of pregnancy with other poor reproductive or obstetric history, unspecified trimester: Secondary | ICD-10-CM

## 2023-01-31 ENCOUNTER — Ambulatory Visit: Payer: 59 | Attending: Obstetrics

## 2023-01-31 ENCOUNTER — Ambulatory Visit: Payer: 59 | Admitting: *Deleted

## 2023-01-31 VITALS — BP 107/67 | HR 83

## 2023-01-31 DIAGNOSIS — O09292 Supervision of pregnancy with other poor reproductive or obstetric history, second trimester: Secondary | ICD-10-CM

## 2023-01-31 DIAGNOSIS — Z8632 Personal history of gestational diabetes: Secondary | ICD-10-CM | POA: Diagnosis present

## 2023-01-31 DIAGNOSIS — O09299 Supervision of pregnancy with other poor reproductive or obstetric history, unspecified trimester: Secondary | ICD-10-CM | POA: Diagnosis not present

## 2023-01-31 DIAGNOSIS — O99212 Obesity complicating pregnancy, second trimester: Secondary | ICD-10-CM | POA: Insufficient documentation

## 2023-01-31 DIAGNOSIS — E669 Obesity, unspecified: Secondary | ICD-10-CM

## 2023-01-31 DIAGNOSIS — Z3A26 26 weeks gestation of pregnancy: Secondary | ICD-10-CM | POA: Diagnosis not present

## 2023-02-28 LAB — OB RESULTS CONSOLE HEPATITIS B SURFACE ANTIGEN: Hepatitis B Surface Ag: NEGATIVE

## 2023-02-28 LAB — OB RESULTS CONSOLE HIV ANTIBODY (ROUTINE TESTING): HIV: NONREACTIVE

## 2023-02-28 LAB — HEPATITIS C ANTIBODY: HCV Ab: NEGATIVE

## 2023-02-28 LAB — OB RESULTS CONSOLE RUBELLA ANTIBODY, IGM: Rubella: IMMUNE

## 2023-03-14 ENCOUNTER — Ambulatory Visit: Payer: 59

## 2023-04-11 LAB — OB RESULTS CONSOLE GBS: GBS: POSITIVE

## 2023-04-12 ENCOUNTER — Encounter (HOSPITAL_COMMUNITY): Payer: Self-pay

## 2023-04-12 ENCOUNTER — Ambulatory Visit (HOSPITAL_COMMUNITY)
Admission: EM | Admit: 2023-04-12 | Discharge: 2023-04-12 | Disposition: A | Discharge status: Home / Self Care | Payer: 59 | Attending: Physician Assistant | Admitting: Physician Assistant

## 2023-04-12 DIAGNOSIS — Z3A37 37 weeks gestation of pregnancy: Secondary | ICD-10-CM | POA: Insufficient documentation

## 2023-04-12 DIAGNOSIS — W44F9XA Other object of natural or organic material, entering into or through a natural orifice, initial encounter: Secondary | ICD-10-CM | POA: Insufficient documentation

## 2023-04-12 DIAGNOSIS — O26893 Other specified pregnancy related conditions, third trimester: Secondary | ICD-10-CM | POA: Diagnosis not present

## 2023-04-12 DIAGNOSIS — Z202 Contact with and (suspected) exposure to infections with a predominantly sexual mode of transmission: Secondary | ICD-10-CM | POA: Insufficient documentation

## 2023-04-12 DIAGNOSIS — T192XXA Foreign body in vulva and vagina, initial encounter: Secondary | ICD-10-CM | POA: Diagnosis present

## 2023-04-12 NOTE — ED Provider Notes (Signed)
[facility]    CSN: [id number] Arrival date & time: 04/12/23  1730      History   Chief Complaint No chief complaint on file.   HPI Patty Wells is a 27 y.o. female.   Patient concerned about a retained vaginal foreign body.  Patient reports that she had intercourse last night.  Her partner was using a condom.  Patient reports they have been unable to find the condom.  Patient also would like testing for sexually transmitted disease.  She was like to have vaginal swab patient does not want HIV or RPR.  Patient is [redacted] weeks pregnant she has follow-up scheduled with her OB/GYN     Past Medical History:  Diagnosis Date   Chlamydia    Vitamin D deficiency     Patient Active Problem List   Diagnosis Date Noted   Postpartum hemorrhage 11/20/2018   NSVD (normal spontaneous vaginal delivery) 11/19/2018    Past Surgical History:  Procedure Laterality Date   NO PAST SURGERIES      OB History     Gravida  3   Para  1   Term  1   Preterm      AB  1   Living  1      SAB      IAB  1   Ectopic      Multiple  0   Live Births  1            Home Medications    Prior to Admission medications   Medication Sig Start Date End Date Taking? Authorizing Provider  Prenatal Vit-Fe Fumarate-FA (PRENATAL MULTIVITAMIN) TABS tablet Take 1 tablet by mouth daily at 12 noon.   Yes [provider]  acetaminophen (TYLENOL) 325 MG tablet Take 2 tablets (650 mg total) by mouth every 4 (four) hours as needed (for pain scale < 4). 11/22/18   [place] Bing, MD  ferrous gluconate (FERGON) 324 MG tablet Take 1 tablet (324 mg total) by mouth daily with breakfast. 11/22/18 12/22/18  [place] Bing, MD  misoprostol (CYTOTEC) 200 MCG tablet Take 1 tablet (200 mcg total) by mouth 3 (three) times daily. Patient not taking: Reported on 03/07/2022 07/30/20   Bernerd Limbo, CNM  naproxen (NAPROSYN) 500 MG tablet Take 1 tablet (500 mg total) by mouth 2 (two) times  daily with a meal. 03/07/22   Wallis Bamberg, PA-C  tiZANidine (ZANAFLEX) 4 MG tablet Take 1 tablet (4 mg total) by mouth at bedtime. 03/07/22   Wallis Bamberg, PA-C    Family History Family History  Problem Relation Age of Onset   Hypertension Mother    Diabetes Maternal Grandmother    Diabetes Paternal Grandmother     Social History Social History   Tobacco Use   Smoking status: Never   Smokeless tobacco: Never  Vaping Use   Vaping Use: Never used  Substance Use Topics   Alcohol use: Never   Drug use: Not Currently    Types: Marijuana    Comment: Not during pregnancy     Allergies   Patient has no known allergies.   Review of Systems Review of Systems  All other systems reviewed and are negative.    Physical Exam Triage Vital Signs ED Triage Vitals  Enc Vitals Group     BP 04/12/23 1818 114/73     Pulse Rate 04/12/23 1818 97     Resp 04/12/23 1818 (!) 22     Temp 04/12/23 1818 98.7  F (37.1 C)     Temp Source 04/12/23 1818 Oral     SpO2 04/12/23 1818 92 %     Weight 04/12/23 1817 230 lb (104.3 kg)     Height --      Head Circumference --      Peak Flow --      Pain Score 04/12/23 1817 0     Pain Loc --      Pain Edu? --      Excl. in GC? --    No data found.  Updated Vital Signs BP 114/73 (BP Location: Right Arm)   Pulse 97   Temp 98.7 F (37.1 C) (Oral)   Resp (!) 22   Wt 104.3 kg   LMP 07/28/2022   SpO2 92%   BMI 39.48 kg/m   Visual Acuity Right Eye Distance:   Left Eye Distance:   Bilateral Distance:    Right Eye Near:   Left Eye Near:    Bilateral Near:     Physical Exam Constitutional:      Appearance: She is well-developed.  HENT:     Head: Normocephalic and atraumatic.  Eyes:     Conjunctiva/sclera: Conjunctivae normal.     Pupils: Pupils are equal, round, and reactive to light.  Cardiovascular:     Rate and Rhythm: Normal rate.  Abdominal:     Palpations: Abdomen is soft.     Tenderness: There is no abdominal tenderness.   Genitourinary:    Vagina: Vaginal discharge present.     Comments: Vaginal discharge,  Thick white,  Adnexa no masses,  Cervix nontender Musculoskeletal:        General: Normal range of motion.  Skin:    General: Skin is warm.      UC Treatments / Results  Labs (all labs ordered are listed, but only abnormal results are displayed) Labs Reviewed - No data to display  EKG   Radiology No results found.  Procedures Procedures (including critical care time)  Medications Ordered in UC Medications - No data to display  Initial Impression / Assessment and Plan / UC Course  I have reviewed the triage vital signs and the nursing notes.  Pertinent labs & imaging results that were available during my care of the patient were reviewed by me and considered in my medical decision making (see chart for details).      Final Clinical Impressions(s) / UC Diagnoses   Final diagnoses:  None     Discharge Instructions      You have test pending for std's   Follow up with gyn for recheck    ED Prescriptions   None    PDMP not reviewed this encounter.   Elson Areas, [place] 04/12/23 1919

## 2023-04-12 NOTE — ED Triage Notes (Signed)
Pt states that she was having sex last night and couldn't find the condom afterwards. She also wants to be tested for STD's

## 2023-04-12 NOTE — Discharge Instructions (Signed)
You have test pending for std's   Follow up with gyn for recheck

## 2023-04-13 LAB — CERVICOVAGINAL ANCILLARY ONLY
Bacterial Vaginitis (gardnerella): NEGATIVE
Candida Glabrata: NEGATIVE
Candida Vaginitis: POSITIVE — AB
Chlamydia: NEGATIVE
Comment: NEGATIVE
Comment: NEGATIVE
Comment: NEGATIVE
Comment: NEGATIVE
Comment: NEGATIVE
Comment: NORMAL
Neisseria Gonorrhea: NEGATIVE
Trichomonas: NEGATIVE

## 2023-04-16 ENCOUNTER — Telehealth: Payer: Self-pay | Admitting: Emergency Medicine

## 2023-04-16 MED ORDER — CLOTRIMAZOLE 1 % VA CREA: 1.0000 | TOPICAL_CREAM | Freq: Every day | VAGINAL | 0 refills | Status: DC

## 2023-05-07 ENCOUNTER — Encounter (HOSPITAL_COMMUNITY): Payer: Self-pay | Admitting: Obstetrics and Gynecology

## 2023-05-07 ENCOUNTER — Inpatient Hospital Stay (HOSPITAL_COMMUNITY)
Admission: AD | Admit: 2023-05-07 | Discharge: 2023-05-10 | DRG: 807 | Disposition: A | Discharge status: Home / Self Care | Payer: 59 | Attending: Obstetrics and Gynecology | Admitting: Obstetrics and Gynecology

## 2023-05-07 ENCOUNTER — Other Ambulatory Visit: Payer: Self-pay

## 2023-05-07 DIAGNOSIS — Z3A40 40 weeks gestation of pregnancy: Secondary | ICD-10-CM | POA: Diagnosis not present

## 2023-05-07 DIAGNOSIS — O99214 Obesity complicating childbirth: Secondary | ICD-10-CM | POA: Diagnosis present

## 2023-05-07 DIAGNOSIS — O480 Post-term pregnancy: Secondary | ICD-10-CM | POA: Diagnosis present

## 2023-05-07 DIAGNOSIS — Z6841 Body Mass Index (BMI) 40.0 and over, adult: Secondary | ICD-10-CM

## 2023-05-07 DIAGNOSIS — O99824 Streptococcus B carrier state complicating childbirth: Secondary | ICD-10-CM | POA: Diagnosis present

## 2023-05-07 DIAGNOSIS — B951 Streptococcus, group B, as the cause of diseases classified elsewhere: Secondary | ICD-10-CM | POA: Diagnosis present

## 2023-05-07 LAB — CBC
HCT: 41.7 % (ref 36.0–46.0)
Hemoglobin: 14.5 g/dL (ref 12.0–15.0)
MCH: 31.1 pg (ref 26.0–34.0)
MCHC: 34.8 g/dL (ref 30.0–36.0)
MCV: 89.5 fL (ref 80.0–100.0)
Platelets: 162 10*3/uL (ref 150–400)
RBC: 4.66 MIL/uL (ref 3.87–5.11)
RDW: 13.5 % (ref 11.5–15.5)
WBC: 6 10*3/uL (ref 4.0–10.5)
nRBC: 0 % (ref 0.0–0.2)

## 2023-05-07 LAB — TYPE AND SCREEN
ABO/RH(D): B POS
Antibody Screen: NEGATIVE

## 2023-05-07 MED ORDER — ACETAMINOPHEN 325 MG PO TABS: 650.0000 mg | ORAL_TABLET | ORAL | Status: DC | PRN

## 2023-05-07 MED ORDER — FENTANYL CITRATE (PF) 100 MCG/2ML IJ SOLN
50.0000 ug | Freq: Once | INTRAMUSCULAR | Status: AC
  Administered 2023-05-07: 50 ug via INTRAVENOUS

## 2023-05-07 MED ORDER — PENICILLIN G POT IN DEXTROSE 60000 UNIT/ML IV SOLN
3.0000 10*6.[IU] | INTRAVENOUS | Status: DC
  Administered 2023-05-07 – 2023-05-08 (×5): 3 10*6.[IU] via INTRAVENOUS
  Filled 2023-05-07 (×5): qty 50

## 2023-05-07 MED ORDER — FENTANYL CITRATE (PF) 100 MCG/2ML IJ SOLN
INTRAMUSCULAR | Status: AC
  Filled 2023-05-07: qty 2

## 2023-05-07 MED ORDER — LIDOCAINE HCL (PF) 1 % IJ SOLN: 30.0000 mL | INTRAMUSCULAR | Status: DC | PRN

## 2023-05-07 MED ORDER — OXYTOCIN-SODIUM CHLORIDE 30-0.9 UT/500ML-% IV SOLN: 2.5000 [IU]/h | INTRAVENOUS | Status: DC

## 2023-05-07 MED ORDER — SODIUM CHLORIDE 0.9 % IV SOLN
5.0000 10*6.[IU] | Freq: Once | INTRAVENOUS | Status: AC
  Administered 2023-05-07: 5 10*6.[IU] via INTRAVENOUS
  Filled 2023-05-07: qty 5

## 2023-05-07 MED ORDER — ONDANSETRON HCL 4 MG/2ML IJ SOLN
4.0000 mg | Freq: Four times a day (QID) | INTRAMUSCULAR | Status: DC | PRN
  Administered 2023-05-08: 4 mg via INTRAVENOUS
  Filled 2023-05-07: qty 2

## 2023-05-07 MED ORDER — LACTATED RINGERS IV SOLN: 500.0000 mL | INTRAVENOUS | Status: DC | PRN

## 2023-05-07 MED ORDER — LACTATED RINGERS IV SOLN: INTRAVENOUS | Status: DC

## 2023-05-07 MED ORDER — OXYTOCIN BOLUS FROM INFUSION
333.0000 mL | Freq: Once | INTRAVENOUS | Status: AC
  Administered 2023-05-08: 333 mL via INTRAVENOUS

## 2023-05-07 MED ORDER — OXYCODONE-ACETAMINOPHEN 5-325 MG PO TABS: 2.0000 | ORAL_TABLET | ORAL | Status: DC | PRN

## 2023-05-07 MED ORDER — OXYCODONE-ACETAMINOPHEN 5-325 MG PO TABS: 1.0000 | ORAL_TABLET | ORAL | Status: DC | PRN

## 2023-05-07 MED ORDER — SOD CITRATE-CITRIC ACID 500-334 MG/5ML PO SOLN: 30.0000 mL | ORAL | Status: DC | PRN

## 2023-05-07 NOTE — H&P (Signed)
OB ADMISSION/ HISTORY & PHYSICAL:  Admission Date: 05/07/2023 12:04 PM  Admit Diagnosis: Pregnancy [Z34.90]    Patty Wells is a 27 y.o. female G3P1011 at [redacted]w[redacted]d presenting for early labor and GBS positive. Patient was seen in the office this AM for a labor check. Started contracting at 0500. SVE 4cm. Discussed admission for antibiotics for GBS and then AROM for augmentation and patient and partner agree to plan. Denies leaking of fluid or vaginal bleeding. Endorses + fetal movement. Partner, Patty Wells, present and supportive. Eagerly anticipating baby girl "Denny Peon".   Prenatal History: G3P1011   EDC: 05/04/2023 Prenatal care at [facility] since 31 weeks, transfer from [facility] for waterbirth option Primary: A. Yetta Barre, CNM  Prenatal course complicated by: GBS positive, PCN in labor BMI 42, declined term ANFTing History of PPH with G1 due to uterine atony  Prenatal Labs: ABO, Rh:   B POS Antibody:   Negative Rubella:   Immune RPR:   Non-reactive HBsAg:   Negative HIV:   Negative GBS:   Positive 1 hr Glucola : 124 Genetic Screening: Panorama low risk XX Ultrasound: normal XX anatomy, anterior placenta, AGA growth at 40 weeks 79%    Maternal Diabetes: No Genetic Screening: Normal Maternal Ultrasounds/Referrals: Normal Fetal Ultrasounds or other Referrals:  None Maternal Substance Abuse:  No Significant Maternal Medications:  None Significant Maternal Lab Results:  Group B Strep positive Other Comments:  None  Medical / Surgical History : Past medical history:  Past Medical History:  Diagnosis Date   Chlamydia    Vitamin D deficiency     Past surgical history:  Past Surgical History:  Procedure Laterality Date   NO PAST SURGERIES      Family History:  Family History  Problem Relation Age of Onset   Hypertension Mother    Diabetes Maternal Grandmother    Diabetes Paternal Grandmother     Social History:  reports that she has never smoked. She has never used smokeless  tobacco. She reports that she does not currently use drugs after having used the following drugs: Marijuana. She reports that she does not drink alcohol.  Allergies: Patient has no known allergies.   Current Medications at time of admission:  Medications Prior to Admission  Medication Sig Dispense Refill Last Dose   acetaminophen (TYLENOL) 325 MG tablet Take 2 tablets (650 mg total) by mouth every 4 (four) hours as needed (for pain scale < 4).      clotrimazole (GYNE-LOTRIMIN) 1 % vaginal cream Place 1 Applicatorful vaginally at bedtime. 45 g 0    ferrous gluconate (FERGON) 324 MG tablet Take 1 tablet (324 mg total) by mouth daily with breakfast. 30 tablet 0    misoprostol (CYTOTEC) 200 MCG tablet Take 1 tablet (200 mcg total) by mouth 3 (three) times daily. (Patient not taking: Reported on 03/07/2022) 3 tablet 0    naproxen (NAPROSYN) 500 MG tablet Take 1 tablet (500 mg total) by mouth 2 (two) times daily with a meal. 30 tablet 0    Prenatal Vit-Fe Fumarate-FA (PRENATAL MULTIVITAMIN) TABS tablet Take 1 tablet by mouth daily at 12 noon.      tiZANidine (ZANAFLEX) 4 MG tablet Take 1 tablet (4 mg total) by mouth at bedtime. 30 tablet 0     Review of Systems: Review of Systems  All other systems reviewed and are negative.  Physical Exam: Vital signs and nursing notes reviewed.  Patient Vitals for the past 24 hrs:  BP Temp Temp src Pulse Resp Height  Weight  05/07/23 1226 122/77 (!) 97.5 F (36.4 C) Oral 92 18 -- --  05/07/23 0700 -- -- -- -- -- 5\' 3"  (1.6 m) 108.9 kg    General: AAO x 3, NAD Heart: RRR Lungs:CTAB Abdomen: Gravid, NT Extremities: no edema SVE:   deferred, SVE in office 4cm  FHR: 140BPM, moderate variability, + accels, no decels TOCO: Contractions q 2-3 minutes  Labs:   No results for input(s): "WBC", "HGB", "HCT", "PLT" in the last 72 hours.  Assessment/Plan: 27 y.o. G3P1011 at [redacted]w[redacted]d, early labor, admit for +GBS  Fetal wellbeing - FHT category 1 EFW AGA  8-9lbs  Labor: Plan PCN for +GBS, plan AROM after second dose  GBS positive Rubella immune Rh positive  Pain control: desires hydrotherapy, open to epidural Analgesia/anesthesia PRN  Anticipated MOD: NSVB  Plans to breastfeed. POC discussed with patient and support team, all questions answered.  Dr. Billy Coast notified of admission/plan of care.  June Leap CNM, MSN 05/07/2023, 1:06 PM

## 2023-05-07 NOTE — Progress Notes (Signed)
S: Feeling cramping. Anxious regarding AROM. Discussed AROM versus discharge to await active labor and patient elects for AROM. Requests IV Fentanyl prior to AROM. Partner, Alex, present and supportive.   O: Vitals:   05/07/23 0700 05/07/23 1226  BP:  122/77  Pulse:  92  Resp:  18  Temp:  (!) 97.5 F (36.4 C)  TempSrc:  Oral  Weight: 108.9 kg   Height: 5\' 3"  (1.6 m)    FHT:  FHR: 130 bpm, variability: moderate,  accelerations:  Present,  decelerations:  Absent UC:   irregular SVE:   Dilation: 4.5 Effacement (%): 60 Station: -3 Exam by:: Dorisann Frames, CNM  AROM of a small amount of clear fluid at 1814.   A / P: Augmentation of labor, GBS +, s/p 2 doses of PCN, poorly tolerant of exams, Fentanyl prior to SVE, AROM for clear fluid  Fetal Wellbeing:  Category I GBS: Positive, PCN x 2 doses, continue until delivery Pain Control:  IV pain meds Anticipated MOD:  NSVD  Plan hydrotherapy in active labor.   Patty Wells, CNM, MSN 05/07/2023, 6:22 PM

## 2023-05-08 ENCOUNTER — Inpatient Hospital Stay (HOSPITAL_COMMUNITY): Payer: 59 | Admitting: Anesthesiology

## 2023-05-08 ENCOUNTER — Encounter (HOSPITAL_COMMUNITY): Payer: Self-pay | Admitting: Obstetrics and Gynecology

## 2023-05-08 DIAGNOSIS — Z6841 Body Mass Index (BMI) 40.0 and over, adult: Secondary | ICD-10-CM

## 2023-05-08 LAB — RPR: RPR Ser Ql: NONREACTIVE

## 2023-05-08 MED ORDER — PHENYLEPHRINE 80 MCG/ML (10ML) SYRINGE FOR IV PUSH (FOR BLOOD PRESSURE SUPPORT): 80.0000 ug | PREFILLED_SYRINGE | INTRAVENOUS | Status: DC | PRN

## 2023-05-08 MED ORDER — DIPHENHYDRAMINE HCL 50 MG/ML IJ SOLN: 12.5000 mg | INTRAMUSCULAR | Status: DC | PRN

## 2023-05-08 MED ORDER — SENNOSIDES-DOCUSATE SODIUM 8.6-50 MG PO TABS
2.0000 | ORAL_TABLET | Freq: Every day | ORAL | Status: DC
  Administered 2023-05-09: 2 via ORAL
  Filled 2023-05-08 (×2): qty 2

## 2023-05-08 MED ORDER — LACTATED RINGERS IV SOLN
500.0000 mL | Freq: Once | INTRAVENOUS | Status: AC
  Administered 2023-05-08: 500 mL via INTRAVENOUS

## 2023-05-08 MED ORDER — TERBUTALINE SULFATE 1 MG/ML IJ SOLN: 0.2500 mg | Freq: Once | INTRAMUSCULAR | Status: DC | PRN

## 2023-05-08 MED ORDER — DIPHENHYDRAMINE HCL 25 MG PO CAPS: 25.0000 mg | ORAL_CAPSULE | Freq: Four times a day (QID) | ORAL | Status: DC | PRN

## 2023-05-08 MED ORDER — EPHEDRINE 5 MG/ML INJ: 10.0000 mg | INTRAVENOUS | Status: DC | PRN

## 2023-05-08 MED ORDER — WITCH HAZEL-GLYCERIN EX PADS: 1.0000 | MEDICATED_PAD | CUTANEOUS | Status: DC | PRN

## 2023-05-08 MED ORDER — ZOLPIDEM TARTRATE 5 MG PO TABS: 5.0000 mg | ORAL_TABLET | Freq: Every evening | ORAL | Status: DC | PRN

## 2023-05-08 MED ORDER — PRENATAL MULTIVITAMIN CH
1.0000 | ORAL_TABLET | Freq: Every day | ORAL | Status: DC
  Administered 2023-05-09 – 2023-05-10 (×2): 1 via ORAL
  Filled 2023-05-08 (×2): qty 1

## 2023-05-08 MED ORDER — LIDOCAINE-EPINEPHRINE (PF) 1.5 %-1:200000 IJ SOLN
INTRAMUSCULAR | Status: DC | PRN
  Administered 2023-05-08: 3 mL via EPIDURAL
  Administered 2023-05-08: 2 mL via EPIDURAL

## 2023-05-08 MED ORDER — ACETAMINOPHEN 325 MG PO TABS
650.0000 mg | ORAL_TABLET | ORAL | Status: DC | PRN
  Administered 2023-05-08 – 2023-05-09 (×3): 650 mg via ORAL
  Filled 2023-05-08 (×3): qty 2

## 2023-05-08 MED ORDER — SIMETHICONE 80 MG PO CHEW: 80.0000 mg | CHEWABLE_TABLET | ORAL | Status: DC | PRN

## 2023-05-08 MED ORDER — TETANUS-DIPHTH-ACELL PERTUSSIS 5-2.5-18.5 LF-MCG/0.5 IM SUSY: 0.5000 mL | PREFILLED_SYRINGE | Freq: Once | INTRAMUSCULAR | Status: DC

## 2023-05-08 MED ORDER — ONDANSETRON HCL 4 MG/2ML IJ SOLN: 4.0000 mg | INTRAMUSCULAR | Status: DC | PRN

## 2023-05-08 MED ORDER — BENZOCAINE-MENTHOL 20-0.5 % EX AERO: 1.0000 | INHALATION_SPRAY | CUTANEOUS | Status: DC | PRN

## 2023-05-08 MED ORDER — FENTANYL-BUPIVACAINE-NACL 0.5-0.125-0.9 MG/250ML-% EP SOLN
12.0000 mL/h | EPIDURAL | Status: DC | PRN
  Administered 2023-05-08: 12 mL/h via EPIDURAL
  Filled 2023-05-08: qty 250

## 2023-05-08 MED ORDER — IBUPROFEN 600 MG PO TABS
600.0000 mg | ORAL_TABLET | Freq: Four times a day (QID) | ORAL | Status: DC
  Administered 2023-05-09 – 2023-05-10 (×6): 600 mg via ORAL
  Filled 2023-05-08 (×6): qty 1

## 2023-05-08 MED ORDER — OXYTOCIN-SODIUM CHLORIDE 30-0.9 UT/500ML-% IV SOLN
1.0000 m[IU]/min | INTRAVENOUS | Status: DC
  Administered 2023-05-08: 2 m[IU]/min via INTRAVENOUS
  Filled 2023-05-08: qty 500

## 2023-05-08 MED ORDER — DIBUCAINE (PERIANAL) 1 % EX OINT: 1.0000 | TOPICAL_OINTMENT | CUTANEOUS | Status: DC | PRN

## 2023-05-08 MED ORDER — ONDANSETRON HCL 4 MG PO TABS: 4.0000 mg | ORAL_TABLET | ORAL | Status: DC | PRN

## 2023-05-08 MED ORDER — MISOPROSTOL 50MCG HALF TABLET
50.0000 ug | ORAL_TABLET | ORAL | Status: DC
  Administered 2023-05-08 (×2): 50 ug via BUCCAL
  Filled 2023-05-08 (×2): qty 1

## 2023-05-08 MED ORDER — COCONUT OIL OIL: 1.0000 | TOPICAL_OIL | Status: DC | PRN

## 2023-05-08 NOTE — Anesthesia Procedure Notes (Signed)
Epidural Patient location during procedure: OB Start time: 05/08/2023 2:57 PM End time: 05/08/2023 3:15 PM  Staffing Anesthesiologist: Lewie Loron, MD Performed: anesthesiologist   Preanesthetic Checklist Completed: patient identified, IV checked, risks and benefits discussed, monitors and equipment checked, pre-op evaluation and timeout performed  Epidural Patient position: sitting Prep: DuraPrep and site prepped and draped Patient monitoring: heart rate, continuous pulse ox and blood pressure Approach: midline Location: L3-L4 Injection technique: LOR air and LOR saline  Needle:  Needle type: Tuohy  Needle gauge: 17 G Needle length: 9 cm Needle insertion depth: 8 cm Catheter type: closed end flexible Catheter size: 19 Gauge Catheter at skin depth: 14 cm Test dose: negative  Assessment Sensory level: T8 Events: blood not aspirated, no cerebrospinal fluid, injection not painful, no injection resistance, no paresthesia and negative IV test  Additional Notes Reason for block:procedure for pain

## 2023-05-08 NOTE — Progress Notes (Signed)
S: Contractions more intense. Tearful over lack of progress. Partner, Alex, present and supportive.   O: Vitals:   05/08/23 0820 05/08/23 1006 05/08/23 1106 05/08/23 1200  BP:  114/75 115/72 113/76  Pulse:  91 92 91  Resp:  15 15 16   Temp:  98.1 F (36.7 C)  98.1 F (36.7 C)  TempSrc:  Oral  Oral  SpO2: 100%     Weight:      Height:       FHT:  FHR: 135 bpm, variability: moderate,  accelerations:  Present,  decelerations:  Absent UC:   regular, every 2-3 minutes SVE:   Dilation: 5 Effacement (%): 60 Station: -3 Exam by:: Dorisann Frames, CNM  A / P: Protracted latent phase  Fetal Wellbeing:  Category I GBS: Positive, PCN ongoing Pain Control:  Labor support without medications Anticipated MOD:   Working towards NSVB  Will start Pitocin 2x2 until active labor. Will turn off in active labor and allow for hydrotherapy.   June Leap, CNM, MSN 05/08/2023, 12:41 PM

## 2023-05-08 NOTE — Progress Notes (Signed)
S: Able to sleep on and off.   O: Vitals:   05/07/23 1226 05/07/23 1843 05/07/23 2011 05/08/23 0258  BP: 122/77  118/68 (!) 107/56  Pulse: 92  98 83  Resp: 18  17 20   Temp: (!) 97.5 F (36.4 C) 98.2 F (36.8 C) 98 F (36.7 C) 98.2 F (36.8 C)  TempSrc: Oral Oral Oral Oral  Weight:      Height:       FHT:  FHR: 130 bpm, variability: moderate,  accelerations:  Present,  decelerations:  Absent UC:   irregular, every 3-7 minutes SVE:   Dilation: 4.5 Effacement (%): 60 Station: -3 Exam by:: Suzanne Kho, CNM  A / P: Protracted latent phase, AROM at 1814, no change in SVE  Fetal Wellbeing:  Category I GBS: Positive, PCN ongoing Pain Control:  Labor support without medications Anticipated MOD:  NSVD  Discussed augmentation of labor with buccal Cytotec and patient agrees to plan of care.   June Leap, CNM, MSN 05/08/2023, 4:03 AM

## 2023-05-08 NOTE — Progress Notes (Signed)
Pt ctx feeling more painful but still spaced; moderate on palpation.

## 2023-05-08 NOTE — Progress Notes (Signed)
S: Comfortable with epidural. Partner, Alex, present and supportive.   O: Vitals:   05/08/23 1555 05/08/23 1600 05/08/23 1608 05/08/23 1630  BP: 112/69 117/67 117/78 117/75  Pulse: 81 78 81 83  Resp:   14   Temp:   98.3 F (36.8 C)   TempSrc:   Oral   SpO2:      Weight:      Height:       FHT:  FHR: 125 bpm, variability: moderate,  accelerations:  Present,  decelerations:  Absent UC:   regular, every 2-3 minutes SVE:   Dilation: 7 Effacement (%): 80 Station: -1 Exam by:: Dorisann Frames, CNM  A / P: Augmentation of labor, progressing well now on Pitocin and with epidural  Fetal Wellbeing:  Category I GBS: Positive, PCN ongoing q 4 hours Pain Control:  Epidural Anticipated MOD:  NSVD  Continue frequent position changes to facilitate fetal rotation and descent.   June Leap, CNM, MSN 05/08/2023, 5:02 PM

## 2023-05-08 NOTE — Anesthesia Preprocedure Evaluation (Signed)
Anesthesia Evaluation  Patient identified by MRN, date of birth, ID band Patient awake    Reviewed: Allergy & Precautions, Patient's Chart, lab work & pertinent test results  Airway Mallampati: II  TM Distance: >3 FB Neck ROM: Full    Dental  (+) Teeth Intact, Dental Advisory Given   Pulmonary neg pulmonary ROS   Pulmonary exam normal        Cardiovascular negative cardio ROS Normal cardiovascular exam Rhythm:Regular Rate:Normal     Neuro/Psych negative neurological ROS     GI/Hepatic negative GI ROS, Neg liver ROS,,,  Endo/Other    Morbid obesity  Renal/GU negative Renal ROS     Musculoskeletal negative musculoskeletal ROS (+)    Abdominal  (+) + obese  Peds  Hematology negative hematology ROS (+) Plt 191k   Anesthesia Other Findings   Reproductive/Obstetrics (+) Pregnancy                             Anesthesia Physical Anesthesia Plan  ASA: 3  Anesthesia Plan: Epidural   Post-op Pain Management:    Induction:   PONV Risk Score and Plan: 2 and Treatment may vary due to age or medical condition  Airway Management Planned: Natural Airway  Additional Equipment:   Intra-op Plan:   Post-operative Plan:   Informed Consent: I have reviewed the patients History and Physical, chart, labs and discussed the procedure including the risks, benefits and alternatives for the proposed anesthesia with the patient or authorized representative who has indicated his/her understanding and acceptance.       Plan Discussed with:   Anesthesia Plan Comments:         Anesthesia Quick Evaluation

## 2023-05-08 NOTE — Progress Notes (Signed)
Pt having a light laboring breakfast. Starting to feel ctx and needing to pause to breathe through them. Planning to take a shower after eating.

## 2023-05-08 NOTE — Progress Notes (Signed)
Pt would like to do nipple stim with hand breast pump. CNM stated this was acceptable.

## 2023-05-09 LAB — CBC
HCT: 36 % (ref 36.0–46.0)
Hemoglobin: 12.3 g/dL (ref 12.0–15.0)
MCH: 29.9 pg (ref 26.0–34.0)
MCHC: 34.2 g/dL (ref 30.0–36.0)
MCV: 87.6 fL (ref 80.0–100.0)
Platelets: 146 10*3/uL — ABNORMAL LOW (ref 150–400)
RBC: 4.11 MIL/uL (ref 3.87–5.11)
RDW: 13.7 % (ref 11.5–15.5)
WBC: 9.2 10*3/uL (ref 4.0–10.5)
nRBC: 0 % (ref 0.0–0.2)

## 2023-05-09 NOTE — Progress Notes (Signed)
       PPD # 1 S/P NSVD  Live born female  Birth Weight: 7 lb 6 oz (3345 g) APGAR: 8, 8  Newborn Delivery   Birth date/time: 05/08/2023 19:10:40 Delivery type: Vaginal, Spontaneous     Baby name: Patty Wells Delivering provider: Dorisann Frames K  Episiotomy:None   Lacerations:None    Feeding: breast and bottle  Pain control at delivery: Epidural   S:  Reports feeling well.             Tolerating po/ No nausea or vomiting             Bleeding is light             Pain controlled with acetaminophen and ibuprofen (OTC)             Up ad lib / ambulatory / voiding without difficulties   O:  A & O x 3, in no apparent distress              VS:  Vitals:   05/08/23 2055 05/08/23 2137 05/09/23 0013 05/09/23 0557  BP: 121/70 124/71 111/68 118/70  Pulse: 70 81 81 83  Resp: 18 18 19 17   Temp: 99.1 F (37.3 C) 98.5 F (36.9 C) 98.6 F (37 C) 98.1 F (36.7 C)  TempSrc: Oral Oral Oral Oral  SpO2: 99% 100% 99% 100%  Weight:      Height:        LABS:  Recent Labs    05/07/23 1234 05/09/23 0451  WBC 6.0 9.2  HGB 14.5 12.3  HCT 41.7 36.0  PLT 162 146*    Blood type: --/--/B POS (06/24 1234)  Rubella: Immune (04/17 0000)   I&O: I/O last 3 completed shifts: In: -  Out: 212 [Urine:100; Blood:112]          No intake/output data recorded.    Gen: AAO x 3, NAD  Abdomen: soft, non-tender, non-distended             Fundus: firm, non-tender, U-1  Perineum: intact  Lochia: small  Extremities: no edema, no calf pain or tenderness    A/P: PPD # 1 26 y.o., [id number]   Principal Problem:   Postpartum care following vaginal delivery 6/25 Active Problems:   Normal labor   Positive GBS test   SVD (spontaneous vaginal delivery)   BMI 40.0-44.9, adult (HCC)   Doing well - stable status  Routine post partum orders  Anticipate discharge tomorrow    Neta Mends, MSN, CNM 05/09/2023, 8:46 AM

## 2023-05-09 NOTE — Anesthesia Postprocedure Evaluation (Signed)
Anesthesia Post Note  Patient: Patty Wells  Procedure(s) Performed: AN AD HOC LABOR EPIDURAL     Patient location during evaluation: Mother Baby Anesthesia Type: Epidural Level of consciousness: awake and alert and oriented Pain management: satisfactory to patient Vital Signs Assessment: post-procedure vital signs reviewed and stable Respiratory status: respiratory function stable Cardiovascular status: stable Postop Assessment: no headache, no backache, epidural receding, patient able to bend at knees, no signs of nausea or vomiting, adequate PO intake and able to ambulate Anesthetic complications: no   No notable events documented.  Last Vitals:  Vitals:   05/09/23 0013 05/09/23 0557  BP: 111/68 118/70  Pulse: 81 83  Resp: 19 17  Temp: 37 C 36.7 C  SpO2: 99% 100%    Last Pain:  Vitals:   05/09/23 0557  TempSrc: Oral  PainSc: 0-No pain   Pain Goal:                   Carlos American

## 2023-05-09 NOTE — Lactation Note (Signed)
This note was copied from a baby's chart. Lactation Consultation Note  Patient Name: Patty Wells [id number] Date: 05/09/2023 Age:27 hours Reason for consult: Initial assessment;Term Mom concerned because baby will not BF. Baby fussy at times, sucking on her hand but will not suck on breast. Mom placed in cradle position, baby wouldn't latch. LC placed in football position baby wouldn't latch. Baby wouldn't suckle on gloved finger. Baby finally chewed and tongue thrust finger out. Baby was tongue thrusting nipple out of mouth. Baby was hungry but doesn't know what to do yet. LC hand expressed some colostrum in a spoon baby took it but was crying in between taking it. But was calm afterwards. LC checked diaper and was dry. Baby awake and calm. Mom very tired.  Mom had asked for DEBP. RN set it up. Newborn feeding habits, behavior, STS, I&O reviewed. Mom has been doing a lot of STS. Baby is calm when STS between mom's breast but she is sleepy now needing to sleep. No support person at bedside. Encouraged mom to call for assistance as needed.  Maternal Data Has patient been taught Hand Expression?: Yes Does the patient have breastfeeding experience prior to this delivery?: Yes How long did the patient breastfeed?: 10 months to her now 27 yr old  Feeding    LATCH Score Latch: Too sleepy or reluctant, no latch achieved, no sucking elicited.  Audible Swallowing: None  Type of Nipple: Everted at rest and after stimulation (very soft very compressible breast tissue/short shaft nipples)  Comfort (Breast/Nipple): Soft / non-tender  Hold (Positioning): Full assist, staff holds infant at breast  LATCH Score: 4   Lactation Tools Discussed/Used Tools: Pump Breast pump type: Double-Electric Breast Pump Pump Education:  (RN set it up) Reason for Pumping: mom's request  Interventions Interventions: Breast feeding basics reviewed;Adjust position;DEBP;Assisted with latch;Support  pillows;Skin to skin;Position options;Breast massage;Education;Hand express;LC Services brochure;Breast compression;Expressed milk  Discharge    Consult Status Consult Status: Follow-up Date: 05/09/23 Follow-up type: In-patient    Charyl Dancer 05/09/2023, 12:57 AM

## 2023-05-10 MED ORDER — BENZOCAINE-MENTHOL 20-0.5 % EX AERO: 1.0000 | INHALATION_SPRAY | CUTANEOUS | Status: AC | PRN

## 2023-05-10 MED ORDER — IBUPROFEN 600 MG PO TABS: 600.0000 mg | ORAL_TABLET | Freq: Four times a day (QID) | ORAL | 0 refills | Status: AC

## 2023-05-10 MED ORDER — COCONUT OIL OIL: 1.0000 | TOPICAL_OIL | 0 refills | Status: AC | PRN

## 2023-05-10 NOTE — Discharge Instructions (Signed)
Lactation outpatient support - home visit   Jessica Bowers, IBCLC (lactation consultant)  & Birth Doula  Phone (text or call): [phone] Email: [email or web address]   Linda Coppola RN, MHA, IBCLC at [facility]: Lactation Consultant  [email or web address] Mail: [email or web address] Tel: [phone]   Additional breastfeeding resources:  [facility] https://ibconline.ca/information-sheets/  [employer] [place]  www.[email or web address]   Other Resources:  Chiropractic specialist   Dr. Leanna Hastings [email or web address]   Craniosacral therapy for baby  Erin Balkind  [email or web address]  Pediatric Dentist (for tongue ties)  Dr. Kate Lambert Spangler, [facility]  Phone: [phone]  [address] [place] [address] [email or web address]  

## 2023-05-10 NOTE — Discharge Summary (Signed)
OB Discharge Summary  Patient Name: Patty Wells DOB: [birth year] MRN: [id number]  Date of admission: 05/07/2023 Delivering provider: Dorisann Frames K   Admitting diagnosis: Pregnancy [Z34.90] Intrauterine pregnancy: [redacted]w[redacted]d     Secondary diagnosis: Patient Active Problem List   Diagnosis Date Noted   SVD (spontaneous vaginal delivery) 05/08/2023   Postpartum care following vaginal delivery 6/25 05/08/2023   BMI 40.0-44.9, adult (HCC) 05/08/2023   Normal labor 05/07/2023   Positive GBS test 05/07/2023   Additional problems:none   Date of discharge: 05/10/2023   Discharge diagnosis: Principal Problem:   Postpartum care following vaginal delivery 6/25 Active Problems:   Normal labor   Positive GBS test   SVD (spontaneous vaginal delivery)   BMI 40.0-44.9, adult (HCC)                                                              Post partum procedures: none  Augmentation: AROM, Pitocin, and Cytotec Pain control: Epidural  Laceration:None  Episiotomy:None  Complications: None  Hospital course:  Onset of Labor With Vaginal Delivery      27 y.o. yo [id number] at [redacted]w[redacted]d was admitted in Latent Labor on 05/07/2023. Labor course was complicated by none  Membrane Rupture Time/Date: 6:14 PM ,05/07/2023   Delivery Method:Vaginal, Spontaneous  Episiotomy: None  Lacerations:  None  Patient had a postpartum course complicated by none.  She is ambulating, tolerating a regular diet, passing flatus, and urinating well. Patient is discharged home in stable condition on 05/10/23.  Newborn Data: Birth date:05/08/2023  Birth time:7:10 PM  Gender:Female  Living status:Living  Apgars:8 ,8  Weight:3345 g   Physical exam  Vitals:   05/09/23 0905 05/09/23 1349 05/09/23 2018 05/10/23 0500  BP: 108/67 106/70 111/82 (!) 94/58  Pulse: 78 87 97 73  Resp: 16 18 18 16   Temp: 98.7 F (37.1 C) 98.6 F (37 C) 98.5 F (36.9 C) 97.7 F (36.5 C)  TempSrc: Oral Oral Oral Oral  SpO2: 99% 99% 99%  100%  Weight:      Height:       General: alert, cooperative, and no distress Lochia: appropriate Uterine Fundus: firm Incision: N/A Perineum: repair intact, no edema DVT Evaluation: No significant calf/ankle edema. Labs: Lab Results  Component Value Date   WBC 9.2 05/09/2023   HGB 12.3 05/09/2023   HCT 36.0 05/09/2023   MCV 87.6 05/09/2023   PLT 146 (L) 05/09/2023      Latest Ref Rng & Units 11/23/2018    4:07 PM  CMP  Glucose 70 - 99 mg/dL 81   BUN 6 - 20 mg/dL 7   Creatinine 1.47 - 8.29 mg/dL 5.62   Sodium 130 - 865 mmol/L 141   Potassium 3.5 - 5.1 mmol/L 3.5   Chloride 98 - 111 mmol/L 107   CO2 22 - 32 mmol/L 24   Calcium 8.9 - 10.3 mg/dL 8.4       7/84/6962    9:05 AM 11/22/2018   10:02 AM  Edinburgh Postnatal Depression Scale Screening Tool  I have been able to laugh and see the funny side of things. 0 0  I have looked forward with enjoyment to things. 0 0  I have blamed myself unnecessarily when things went wrong. 1 1  I have been  anxious or worried for no good reason. 0 0  I have felt scared or panicky for no good reason. 0 0  Things have been getting on top of me. 1 0  I have been so unhappy that I have had difficulty sleeping. 0 0  I have felt sad or miserable. 0 0  I have been so unhappy that I have been crying. 1 0  The thought of harming myself has occurred to me. 0 0  Edinburgh Postnatal Depression Scale Total 3 1   Discharge instruction:  per After Visit Summary,  [facility] booklet and  "Understanding Mother & Baby Care" hospital booklet After Visit Meds:  Allergies as of 05/10/2023   No Known Allergies      Medication List     STOP taking these medications    clotrimazole 1 % vaginal cream Commonly known as: GYNE-LOTRIMIN   ferrous gluconate 324 MG tablet Commonly known as: FERGON   misoprostol 200 MCG tablet Commonly known as: CYTOTEC   naproxen 500 MG tablet Commonly known as: NAPROSYN   tiZANidine 4 MG tablet Commonly  known as: Zanaflex       TAKE these medications    acetaminophen 325 MG tablet Commonly known as: Tylenol Take 2 tablets (650 mg total) by mouth every 4 (four) hours as needed (for pain scale < 4).   benzocaine-Menthol 20-0.5 % Aero Commonly known as: DERMOPLAST Apply 1 Application topically as needed for irritation (perineal discomfort).   coconut oil Oil Apply 1 Application topically as needed.   ibuprofen 600 MG tablet Commonly known as: ADVIL Take 1 tablet (600 mg total) by mouth every 6 (six) hours.   prenatal multivitamin Tabs tablet Take 1 tablet by mouth daily at 12 noon.               Discharge Care Instructions  (From admission, onward)           Start     Ordered   05/10/23 0000  Discharge wound care:       Comments: Sitz baths 2 times /day with warm water x 1 week. May add herbals: 1 ounce dried comfrey leaf* 1 ounce calendula flowers 1 ounce lavender flowers  Supplies can be found online at [employer] sources at [employer], [employer]  1/2 ounce dried uva ursi leaves 1/2 ounce witch hazel blossoms (if you can find them) 1/2 ounce dried sage leaf 1/2 cup sea salt Directions: Bring 2 quarts of water to a boil. Turn off heat, and place 1 ounce (approximately 1 large handful) of the above mixed herbs (not the salt) into the pot. Steep, covered, for 30 minutes.  Strain the liquid well with a fine mesh strainer, and discard the herb material. Add 2 quarts of liquid to the tub, along with the 1/2 cup of salt. This medicinal liquid can also be made into compresses and peri-rinses.   05/10/23 1033           Diet: routine diet Activity: Advance as tolerated. Pelvic rest for 6 weeks.  Postpartum contraception: TBA in office Newborn Data: Live born female  Birth Weight: 7 lb 6 oz (3345 g) APGAR: 8, 8  Newborn Delivery   Birth date/time: 05/08/2023 19:10:40 Delivery type: Vaginal, Spontaneous      named Averie Baby Feeding:  Breast Disposition:home with mother Delivery Report: Review the Delivery Report for details.   Follow up:  Follow-up Information     June Leap, CNM. Schedule an  appointment as soon as possible for a visit in 6 week(s).   Specialty: Certified Nurse Midwife Why: For Postpartum follow-up Contact information: [address] [place] [address] [phone]                Signed: Cipriano Mile, MSN 05/10/2023, 10:33 AM

## 2023-05-10 NOTE — Lactation Note (Signed)
This note was copied from a baby's chart. Lactation Consultation Note  Patient Name: Patty Wells [id number] Date: 05/10/2023 Age:27 hours Reason for consult: Follow-up assessment  Infant fed formula due to concern with low production but mother desires to primarily breastfeed. Assisted with position and latch, noted transitional milk with hand expression and mother more reassured. Discussed signs of milk transfer and lactogenesis II. Encouraged bf at earliest hunger cues, using formula or expressed milk as needed if baby fussy or not transferring adequate volumes. Pumping if supplementing recommended. Discussed supply and demand. Mother has a pump at home, provided milk storage guidelines.   Maternal Data    Feeding Nipple Type: Extra Slow Flow  LATCH Score Latch: Grasps breast easily, tongue down, lips flanged, rhythmical sucking.  Audible Swallowing: A few with stimulation  Type of Nipple: Everted at rest and after stimulation  Comfort (Breast/Nipple): Soft / non-tender  Hold (Positioning): Assistance needed to correctly position infant at breast and maintain latch.  LATCH Score: 8   Lactation Tools Discussed/Used    Interventions Interventions: Breast feeding basics reviewed;Assisted with latch;Hand express;Adjust position;Support pillows  Discharge Discharge Education: Engorgement and breast care  Consult Status Consult Status: Complete Date: 05/10/23    Claiborne Billings 05/10/2023, 2:10 PM

## 2023-05-31 ENCOUNTER — Telehealth (HOSPITAL_COMMUNITY): Payer: Self-pay | Admitting: *Deleted

## 2023-05-31 NOTE — Telephone Encounter (Signed)
05/31/2023  Name: Polly Barner MRN: [id number] DOB: February 17, 1996  Reason for Call:  Transition of Care Hospital Discharge Call  Contact Status: Patient Contact Status: Complete  Language assistant needed: Interpreter Mode: Interpreter Not Needed        Follow-Up Questions: Do You Have Any Concerns About Your Health As You Heal From Delivery?: No Do You Have Any Concerns About Your Infants Health?: No  Edinburgh Postnatal Depression Scale:  In the Past 7 Days: I have been able to laugh and see the funny side of things.: As much as I always could I have looked forward with enjoyment to things.: As much as I ever did I have blamed myself unnecessarily when things went wrong.: Not very often I have been anxious or worried for no good reason.: No, not at all I have felt scared or panicky for no good reason.: No, not at all Things have been getting on top of me.: No, most of the time I have coped quite well I have been so unhappy that I have had difficulty sleeping.: Not at all I have felt sad or miserable.: Not very often I have been so unhappy that I have been crying.: No, never The thought of harming myself has occurred to me.: Never Inocente Salles Postnatal Depression Scale Total: 3  PHQ2-9 Depression Scale:     Discharge Follow-up: Edinburgh score requires follow up?: No Patient was advised of the following resources:: Breastfeeding Support Group, Support Group Did patient express any COVID concerns?: No  Post-discharge interventions: Reviewed Newborn Safe Sleep Practices  Salena Saner, RN 05/31/2023 16:17
